# Patient Record
Sex: Female | Born: 1990 | Race: Black or African American | Hispanic: No | Marital: Single | Smoking: Former smoker
Health system: Southern US, Community
[De-identification: ages and names within clinical notes are randomized; demographics above are authoritative.]

## PROBLEM LIST (undated history)

## (undated) ENCOUNTER — Inpatient Hospital Stay: Payer: Self-pay

## (undated) DIAGNOSIS — Z789 Other specified health status: Secondary | ICD-10-CM

## (undated) HISTORY — PX: BREAST LUMPECTOMY: SHX2

## (undated) HISTORY — PX: OTHER SURGICAL HISTORY: SHX169

---

## 2014-01-24 NOTE — L&D Delivery Note (Signed)
Delivery Note At 1:02 AM a viable female was delivered via Vaginal, Spontaneous Delivery (Presentation: LOA ;  ). Mild shoulder dystocia relieved by McRobert's and suprapubic pressure   APGAR: 8, 9; weight  .  Vigorous female with spontaneous cry . Delayed cord clamp  Placenta status: Intact, Spontaneous.  Cord:3v  with the following complications:None.    Anesthesia: Lidocaine , 50 mcg Fentanyl IV Episiotomy: none   Lacerations: second degree and bilateral labial laceration   Suture Repair: 00 + 000 vicrylEst. Blood Loss (mL):200 cc   Mom to postpartum.  Baby to Couplet care / Skin to Skin breast feeding .  SCHERMERHORN,THOMAS 10/22/2014, 1:45 AM

## 2014-04-25 ENCOUNTER — Emergency Department
Admit: 2014-04-25 | Disposition: A | Discharge status: Home / Self Care | Payer: Self-pay | Admitting: Emergency Medicine

## 2014-04-25 LAB — URINALYSIS, COMPLETE
BLOOD: NEGATIVE
Bilirubin,UR: NEGATIVE
Glucose,UR: NEGATIVE mg/dL (ref 0–75)
NITRITE: POSITIVE
PH: 6 (ref 4.5–8.0)
RBC,UR: 10 /HPF (ref 0–5)
Specific Gravity: 1.027 (ref 1.003–1.030)
WBC UR: 11 /HPF (ref 0–5)

## 2014-04-25 LAB — CBC
HCT: 36.1 % (ref 35.0–47.0)
HGB: 12.1 g/dL (ref 12.0–16.0)
MCH: 30.4 pg (ref 26.0–34.0)
MCHC: 33.5 g/dL (ref 32.0–36.0)
MCV: 91 fL (ref 80–100)
PLATELETS: 161 10*3/uL (ref 150–440)
RBC: 3.98 10*6/uL (ref 3.80–5.20)
RDW: 12.8 % (ref 11.5–14.5)
WBC: 8.7 10*3/uL (ref 3.6–11.0)

## 2014-04-25 LAB — COMPREHENSIVE METABOLIC PANEL
ANION GAP: 6 — AB (ref 7–16)
AST: 24 U/L
Albumin: 4.1 g/dL
Alkaline Phosphatase: 36 U/L — ABNORMAL LOW
BUN: 13 mg/dL
Bilirubin,Total: 0.8 mg/dL
CO2: 23 mmol/L
Calcium, Total: 9.5 mg/dL
Chloride: 106 mmol/L
Creatinine: 0.51 mg/dL
EGFR (African American): >60
EGFR (Non-African Amer.): >60
Glucose: 96 mg/dL
Potassium: 3.6 mmol/L
SGPT (ALT): 19 U/L
Sodium: 135 mmol/L
Total Protein: 7.6 g/dL

## 2014-04-25 LAB — HCG, QUANTITATIVE, PREGNANCY: Beta Hcg, Quant.: 157530 m[IU]/mL — ABNORMAL HIGH

## 2014-04-25 LAB — LIPASE, BLOOD: LIPASE: 20 U/L — AB

## 2014-05-09 LAB — OB RESULTS CONSOLE ABO/RH: RH Type: POSITIVE

## 2014-05-09 LAB — OB RESULTS CONSOLE VARICELLA ZOSTER ANTIBODY, IGG: VARICELLA IGG: IMMUNE

## 2014-05-09 LAB — OB RESULTS CONSOLE RPR: RPR: NONREACTIVE

## 2014-05-10 LAB — OB RESULTS CONSOLE GC/CHLAMYDIA
Chlamydia: POSITIVE
Gonorrhea: NEGATIVE

## 2014-05-21 LAB — OB RESULTS CONSOLE RUBELLA ANTIBODY, IGM: RUBELLA: IMMUNE

## 2014-09-19 LAB — OB RESULTS CONSOLE GC/CHLAMYDIA
Chlamydia: NEGATIVE
GC PROBE AMP, GENITAL: NEGATIVE

## 2014-09-19 LAB — OB RESULTS CONSOLE GBS: GBS: NEGATIVE

## 2014-10-21 ENCOUNTER — Inpatient Hospital Stay
Admission: EM | Admit: 2014-10-21 | Discharge: 2014-10-24 | DRG: 775 | Disposition: A | Discharge status: Home / Self Care | Payer: Medicaid Other | Attending: Obstetrics and Gynecology | Admitting: Obstetrics and Gynecology

## 2014-10-21 ENCOUNTER — Observation Stay
Admission: EM | Admit: 2014-10-21 | Discharge: 2014-10-21 | Disposition: A | Discharge status: Home / Self Care | Payer: Medicaid Other | Source: Home / Self Care | Admitting: Obstetrics and Gynecology

## 2014-10-21 DIAGNOSIS — Z3A40 40 weeks gestation of pregnancy: Secondary | ICD-10-CM

## 2014-10-21 DIAGNOSIS — Z3A39 39 weeks gestation of pregnancy: Secondary | ICD-10-CM | POA: Diagnosis present

## 2014-10-21 DIAGNOSIS — O479 False labor, unspecified: Secondary | ICD-10-CM | POA: Diagnosis present

## 2014-10-21 DIAGNOSIS — Z3403 Encounter for supervision of normal first pregnancy, third trimester: Secondary | ICD-10-CM | POA: Diagnosis present

## 2014-10-21 LAB — CBC
HEMATOCRIT: 37.3 % (ref 35.0–47.0)
Hemoglobin: 12.6 g/dL (ref 12.0–16.0)
MCH: 31.3 pg (ref 26.0–34.0)
MCHC: 33.8 g/dL (ref 32.0–36.0)
MCV: 92.4 fL (ref 80.0–100.0)
PLATELETS: 187 10*3/uL (ref 150–440)
RBC: 4.03 MIL/uL (ref 3.80–5.20)
RDW: 12.6 % (ref 11.5–14.5)
WBC: 15.5 10*3/uL — ABNORMAL HIGH (ref 3.6–11.0)

## 2014-10-21 LAB — RAPID HIV SCREEN (HIV 1/2 AB+AG)
HIV 1/2 Antibodies: NONREACTIVE
HIV-1 P24 ANTIGEN - HIV24: NONREACTIVE

## 2014-10-21 LAB — TYPE AND SCREEN
ABO/RH(D): A POS
Antibody Screen: NEGATIVE

## 2014-10-21 LAB — ABO/RH: ABO/RH(D): A POS

## 2014-10-21 MED ORDER — LACTATED RINGERS IV SOLN
INTRAVENOUS | Status: DC
  Administered 2014-10-21: 125 mL/h via INTRAVENOUS

## 2014-10-21 MED ORDER — BUTORPHANOL TARTRATE 1 MG/ML IJ SOLN
1.0000 mg | INTRAMUSCULAR | Status: DC | PRN
  Administered 2014-10-21: 1 mg via INTRAVENOUS
  Filled 2014-10-21: qty 1

## 2014-10-21 MED ORDER — PENICILLIN G POTASSIUM 5000000 UNITS IJ SOLR
2.5000 10*6.[IU] | INTRAVENOUS | Status: DC
  Filled 2014-10-21 (×4): qty 2.5

## 2014-10-21 MED ORDER — LACTATED RINGERS IV SOLN: 500.0000 mL | INTRAVENOUS | Status: DC | PRN

## 2014-10-21 MED ORDER — OXYCODONE-ACETAMINOPHEN 5-325 MG PO TABS
2.0000 | ORAL_TABLET | ORAL | Status: DC | PRN
Start: 2014-10-21 — End: 2014-10-22

## 2014-10-21 MED ORDER — PENICILLIN G POTASSIUM 5000000 UNITS IJ SOLR
5.0000 10*6.[IU] | Freq: Once | INTRAMUSCULAR | Status: DC
  Filled 2014-10-21: qty 5

## 2014-10-21 MED ORDER — LIDOCAINE HCL (PF) 1 % IJ SOLN: 30.0000 mL | INTRAMUSCULAR | Status: DC | PRN

## 2014-10-21 MED ORDER — OXYTOCIN BOLUS FROM INFUSION: 500.0000 mL | INTRAVENOUS | Status: DC

## 2014-10-21 MED ORDER — OXYTOCIN 40 UNITS IN LACTATED RINGERS INFUSION - SIMPLE MED
62.5000 mL/h | INTRAVENOUS | Status: DC
  Administered 2014-10-22: 62.5 mL/h via INTRAVENOUS

## 2014-10-21 MED ORDER — CITRIC ACID-SODIUM CITRATE 334-500 MG/5ML PO SOLN: 30.0000 mL | ORAL | Status: DC | PRN

## 2014-10-21 MED ORDER — ACETAMINOPHEN 325 MG PO TABS: 650.0000 mg | ORAL_TABLET | ORAL | Status: DC | PRN

## 2014-10-21 MED ORDER — OXYCODONE-ACETAMINOPHEN 5-325 MG PO TABS
1.0000 | ORAL_TABLET | ORAL | Status: DC | PRN
Start: 2014-10-21 — End: 2014-10-22

## 2014-10-21 MED ORDER — ONDANSETRON HCL 4 MG/2ML IJ SOLN: 4.0000 mg | Freq: Four times a day (QID) | INTRAMUSCULAR | Status: DC | PRN

## 2014-10-21 NOTE — Progress Notes (Signed)
Patient ID: Kara Moses, female   DOB: 11-28-1990, 24 y.o.   MRN: [id number] SHAVANA CALDER 1991-01-22 G1 P0 [redacted]w[redacted]d presents for ctx  No LOF , small  vaginal bleeding , O;BP 123/73 mmHg  Pulse 92  Temp(Src) 98.7 F (37.1 C)  LMP 01/12/2014 (LMP Unknown) ABDsoft , gravid  NT CX tft by RN  NSTreactive , reasurring Labs: none A: CTX , not in labor yet  P:precautions given , daily FMA ( kick counts)  [facility] appt this Thursday

## 2014-10-21 NOTE — OB Triage Note (Signed)
Pt presents to l/d with c/o contractions since 0600. Pt states she has been receiving prenatal care in [place] until 3 weeks ago when she moved back to [place],

## 2014-10-21 NOTE — OB Triage Note (Signed)
Pt here earlier today. States she was not able to get any rest when she went home. Small amount brown discharge. ( from earlier vaginal exam today)

## 2014-10-21 NOTE — Plan of Care (Signed)
Pt seen by dr schermerhorn. Monitor strip reviewed by him. Reactive nst. Ok to discharge pt home. Pt d/c home with d/c instructions

## 2014-10-21 NOTE — H&P (Signed)
Kara Moses is a 24 y.o. female G1P0 @ 39+0 weeks  presenting for labor d/c earlier in the day in early labor . Now at 4 cm / 80 /-2. Marland KitchenAROM with + meconium stained fluid  History OB History    Gravida Para Term Preterm AB TAB SAB Ectopic Multiple Living   1              No past medical history on file. No past surgical history on file. Family History: family history is not on file. Social History:  has no tobacco, alcohol, and drug history on file.   Prenatal Transfer Tool  Maternal Diabetes: No Genetic Screening: Normal Maternal Ultrasounds/Referrals: Normal Fetal Ultrasounds or other Referrals:  None Maternal Substance Abuse:  No Significant Maternal Medications:  None Significant Maternal Lab Results:  H/o + chlamydia --- treated  04/2014 Other Comments:  None  ROS  Dilation: 4 Effacement (%): 90 Station: -2 Exam by:: t.roberts RN Blood pressure 127/85, pulse 83, temperature 98.3 F (36.8 C), temperature source Oral, resp. rate 20, last menstrual period 01/12/2014. Exam Lungs CTA  cv RRR  Pelvic 4/80/-2 vtx  FSE placed  Physical Exam  Prenatal labs: ABO, Rh: A/Positive/-- (04/15 0000) Antibody:neg Rubella: Immune (04/27 0000) RPR: Nonreactive (04/15 0000)  HBsAg: neg HIV: neg  GBS: Negative (08/26 0000)   Assessment/Plan: Term gestation with reassuring fetal monitoring , active labor  Meconium stained amniotic fluid   Cont monitoring . Offfered CLE vs stadol  SCHERMERHORN,THOMAS 10/21/2014, 8:43 PM

## 2014-10-22 LAB — CBC
HEMATOCRIT: 34.3 % — AB (ref 35.0–47.0)
HEMOGLOBIN: 11.7 g/dL — AB (ref 12.0–16.0)
MCH: 31.6 pg (ref 26.0–34.0)
MCHC: 34 g/dL (ref 32.0–36.0)
MCV: 93 fL (ref 80.0–100.0)
Platelets: 179 10*3/uL (ref 150–440)
RBC: 3.69 MIL/uL — AB (ref 3.80–5.20)
RDW: 12.4 % (ref 11.5–14.5)
WBC: 21.7 10*3/uL — ABNORMAL HIGH (ref 3.6–11.0)

## 2014-10-22 MED ORDER — IBUPROFEN 600 MG PO TABS
600.0000 mg | ORAL_TABLET | Freq: Four times a day (QID) | ORAL | Status: DC
  Administered 2014-10-22 – 2014-10-24 (×8): 600 mg via ORAL
  Filled 2014-10-22 (×9): qty 1

## 2014-10-22 MED ORDER — BENZOCAINE-MENTHOL 20-0.5 % EX AERO
1.0000 "application " | INHALATION_SPRAY | CUTANEOUS | Status: DC | PRN
  Administered 2014-10-22: 1 via TOPICAL
  Filled 2014-10-22: qty 56

## 2014-10-22 MED ORDER — SIMETHICONE 80 MG PO CHEW: 80.0000 mg | CHEWABLE_TABLET | ORAL | Status: DC | PRN

## 2014-10-22 MED ORDER — MAGNESIUM HYDROXIDE 400 MG/5ML PO SUSP: 30.0000 mL | ORAL | Status: DC | PRN

## 2014-10-22 MED ORDER — OXYCODONE-ACETAMINOPHEN 5-325 MG PO TABS
1.0000 | ORAL_TABLET | ORAL | Status: DC | PRN
  Administered 2014-10-22 – 2014-10-24 (×5): 1 via ORAL
  Filled 2014-10-22 (×5): qty 1

## 2014-10-22 MED ORDER — FERROUS SULFATE 325 (65 FE) MG PO TABS
325.0000 mg | ORAL_TABLET | Freq: Two times a day (BID) | ORAL | Status: DC
  Administered 2014-10-22 – 2014-10-24 (×5): 325 mg via ORAL
  Filled 2014-10-22 (×5): qty 1

## 2014-10-22 MED ORDER — SENNOSIDES-DOCUSATE SODIUM 8.6-50 MG PO TABS
2.0000 | ORAL_TABLET | ORAL | Status: DC
  Administered 2014-10-23 – 2014-10-24 (×2): 2 via ORAL
  Filled 2014-10-22 (×2): qty 2

## 2014-10-22 MED ORDER — WITCH HAZEL-GLYCERIN EX PADS: 1.0000 "application " | MEDICATED_PAD | CUTANEOUS | Status: DC | PRN

## 2014-10-22 MED ORDER — FENTANYL CITRATE (PF) 100 MCG/2ML IJ SOLN
INTRAMUSCULAR | Status: AC
Start: 2014-10-22 — End: 2014-10-22
  Administered 2014-10-22: 50 ug via INTRAVENOUS
  Filled 2014-10-22: qty 2

## 2014-10-22 MED ORDER — MEASLES, MUMPS & RUBELLA VAC ~~LOC~~ INJ: 0.5000 mL | INJECTION | Freq: Once | SUBCUTANEOUS | Status: DC

## 2014-10-22 MED ORDER — NITROGLYCERIN 5 MG/ML IV SOLN
INTRAVENOUS | Status: AC
  Filled 2014-10-22: qty 10

## 2014-10-22 MED ORDER — DIBUCAINE 1 % RE OINT: 1.0000 "application " | TOPICAL_OINTMENT | RECTAL | Status: DC | PRN

## 2014-10-22 MED ORDER — DIPHENHYDRAMINE HCL 25 MG PO CAPS: 25.0000 mg | ORAL_CAPSULE | Freq: Four times a day (QID) | ORAL | Status: DC | PRN

## 2014-10-22 MED ORDER — ZOLPIDEM TARTRATE 5 MG PO TABS: 5.0000 mg | ORAL_TABLET | Freq: Every evening | ORAL | Status: DC | PRN

## 2014-10-22 MED ORDER — ACETAMINOPHEN 325 MG PO TABS: 650.0000 mg | ORAL_TABLET | ORAL | Status: DC | PRN

## 2014-10-22 MED ORDER — LANOLIN HYDROUS EX OINT: TOPICAL_OINTMENT | CUTANEOUS | Status: DC | PRN

## 2014-10-22 MED ORDER — OXYTOCIN 40 UNITS IN LACTATED RINGERS INFUSION - SIMPLE MED: 62.5000 mL/h | INTRAVENOUS | Status: DC | PRN

## 2014-10-22 MED ORDER — PRENATAL MULTIVITAMIN CH
1.0000 | ORAL_TABLET | Freq: Every day | ORAL | Status: DC
  Administered 2014-10-22 – 2014-10-24 (×3): 1 via ORAL
  Filled 2014-10-22 (×3): qty 1

## 2014-10-22 MED ORDER — ONDANSETRON HCL 4 MG/2ML IJ SOLN: 4.0000 mg | INTRAMUSCULAR | Status: DC | PRN

## 2014-10-22 MED ORDER — ONDANSETRON HCL 4 MG PO TABS: 4.0000 mg | ORAL_TABLET | ORAL | Status: DC | PRN

## 2014-10-22 MED ORDER — FENTANYL CITRATE (PF) 100 MCG/2ML IJ SOLN
50.0000 ug | Freq: Once | INTRAMUSCULAR | Status: AC
  Administered 2014-10-22: 50 ug via INTRAVENOUS

## 2014-10-23 LAB — RPR: RPR Ser Ql: NONREACTIVE

## 2014-10-23 NOTE — Progress Notes (Signed)
PPD #1, SVD, baby girl   S:  Reports feeling good             Tolerating po/ No nausea or vomiting             Bleeding is light             Pain controlled with Motrin and Percocet             Up ad lib / ambulatory / voiding QS  Newborn breast feeding - going well   O:               VS: BP 109/57 mmHg  Pulse 71  Temp(Src) 98.3 F (36.8 C) (Oral)  Resp 18  SpO2 99%  LMP 01/12/2014 (LMP Unknown)  Breastfeeding? Unknown   LABS:              Recent Labs  10/21/14 2101 10/22/14 0604  WBC 15.5* 21.7*  HGB 12.6 11.7*  PLT 187 179               Blood type: --/--/A POS (09/27 2102)  Rubella: Immune (04/27 0000)                     I&O: Intake/Output      09/28 0701 - 09/29 0700 09/29 0701 - 09/30 0700   P.O. 240    I.V.     Total Intake 240     Urine 800    Blood     Total Output 800     Net -560                        Physical Exam:             Alert and oriented X3  Lungs: Clear and unlabored  Heart: regular rate and rhythm / no mumurs  Abdomen: soft, non-tender, non-distended              Fundus: firm, non-tender, U-E  Perineum: well approximated 2nd degree laceration and bilateral labial lacs - healing well, no significant edema, no ecchymosis, no erythema  Lochia: light, no clots  Extremities: no edema, no calf pain or tenderness    A: PPD # 1, SVD   Doing well - stable status  P: Routine post partum orders  May shower and ambulate in halls today   Planning Nexplanon for birth control  Anticipate discharge home tomorrow  Karena Addison, CNM

## 2014-10-24 MED ORDER — IBUPROFEN 600 MG PO TABS
600.0000 mg | ORAL_TABLET | Freq: Four times a day (QID) | ORAL | Status: DC
Start: 2014-10-24 — End: 2016-08-31

## 2014-10-24 MED ORDER — BENZOCAINE-MENTHOL 20-0.5 % EX AERO: 1.0000 "application " | INHALATION_SPRAY | CUTANEOUS | Status: DC | PRN

## 2014-10-24 MED ORDER — OXYCODONE-ACETAMINOPHEN 5-325 MG PO TABS: 1.0000 | ORAL_TABLET | ORAL | Status: DC | PRN

## 2014-10-24 NOTE — Discharge Summary (Signed)
Obstetric Discharge Summary Reason for Admission: onset of labor Prenatal Procedures: none Intrapartum Procedures: spontaneous vaginal delivery Postpartum Procedures: none Complications-Operative and Postpartum: 2 degree perineal laceration HEMOGLOBIN  Date Value Ref Range Status  10/22/2014 11.7* 12.0 - 16.0 g/dL Final   HGB  Date Value Ref Range Status  04/25/2014 12.1 12.0-16.0 g/dL Final   HCT  Date Value Ref Range Status  10/22/2014 34.3* 35.0 - 47.0 % Final  04/25/2014 36.1 35.0-47.0 % Final    Physical Exam:  General: alert and cooperative Lochia: appropriate Uterine Fundus: firm Incision: n/a DVT Evaluation: No evidence of DVT seen on physical exam. Lungs CTA, CV RRR  Discharge Diagnoses: Term Pregnancy-delivered  Discharge Information: Date: 10/24/2014 Activity: pelvic rest Diet: routine Medications: Ibuprofen and Percocet Condition: stable Instructions: refer to practice specific booklet Discharge to: home Follow-up Information    Follow up with SCHERMERHORN,THOMAS, MD In 6 weeks.   Specialty:  Obstetrics and Gynecology   Why:  postpartum care   Contact information:   [address] [place] [address] [phone]       Newborn Data: Live born female  Birth Weight: 7 lb 15 oz (3600 g) APGAR: 8, 9  Home with mother.  SCHERMERHORN,THOMAS 10/24/2014, 8:50 AM

## 2014-10-24 NOTE — Progress Notes (Signed)
Patient understands all discharge instructions and the need to make follow up appointments. Patient discharge via wheelchair with auxillary. 

## 2014-11-30 ENCOUNTER — Emergency Department
Admission: EM | Admit: 2014-11-30 | Discharge: 2014-11-30 | Discharge status: Against Medical Advice | Payer: Medicaid Other | Attending: Emergency Medicine | Admitting: Emergency Medicine

## 2014-11-30 ENCOUNTER — Encounter: Payer: Self-pay | Admitting: Emergency Medicine

## 2014-11-30 DIAGNOSIS — N939 Abnormal uterine and vaginal bleeding, unspecified: Secondary | ICD-10-CM | POA: Insufficient documentation

## 2014-11-30 LAB — URINALYSIS COMPLETE WITH MICROSCOPIC (ARMC ONLY)
Bacteria, UA: NONE SEEN
Bilirubin Urine: NEGATIVE
GLUCOSE, UA: NEGATIVE mg/dL
Ketones, ur: NEGATIVE mg/dL
Nitrite: NEGATIVE
Protein, ur: 100 mg/dL — AB
Specific Gravity, Urine: 1.015 (ref 1.005–1.030)
pH: 7 (ref 5.0–8.0)

## 2014-11-30 LAB — COMPREHENSIVE METABOLIC PANEL
ALT: 11 U/L — AB (ref 14–54)
AST: 15 U/L (ref 15–41)
Albumin: 4.2 g/dL (ref 3.5–5.0)
Alkaline Phosphatase: 61 U/L (ref 38–126)
Anion gap: 3 — ABNORMAL LOW (ref 5–15)
BUN: 11 mg/dL (ref 6–20)
CHLORIDE: 110 mmol/L (ref 101–111)
CO2: 27 mmol/L (ref 22–32)
CREATININE: 0.7 mg/dL (ref 0.44–1.00)
Calcium: 9.3 mg/dL (ref 8.9–10.3)
GFR calc Af Amer: >60 mL/min (ref >60)
Glucose, Bld: 92 mg/dL (ref 65–99)
Potassium: 3.7 mmol/L (ref 3.5–5.1)
SODIUM: 140 mmol/L (ref 135–145)
Total Bilirubin: 1.4 mg/dL — ABNORMAL HIGH (ref 0.3–1.2)
Total Protein: 7.4 g/dL (ref 6.5–8.1)

## 2014-11-30 LAB — CBC
HCT: 39.5 % (ref 35.0–47.0)
Hemoglobin: 13.2 g/dL (ref 12.0–16.0)
MCH: 30.3 pg (ref 26.0–34.0)
MCHC: 33.5 g/dL (ref 32.0–36.0)
MCV: 90.3 fL (ref 80.0–100.0)
PLATELETS: 151 10*3/uL (ref 150–440)
RBC: 4.38 MIL/uL (ref 3.80–5.20)
RDW: 11.6 % (ref 11.5–14.5)
WBC: 5.2 10*3/uL (ref 3.6–11.0)

## 2014-11-30 LAB — POCT PREGNANCY, URINE: Preg Test, Ur: NEGATIVE

## 2014-11-30 NOTE — ED Notes (Signed)
POC urine preg negative 

## 2014-11-30 NOTE — ED Notes (Signed)
Called at [phone] and [phone]  No answer in lobby

## 2014-11-30 NOTE — ED Notes (Signed)
Pt presents to Er with continued vaginal bleeding since giving birth 5 weeks ago.Pt reports larger than quarter sized blood clots prior to arrival with mild abdominal cramping; goes through 6 pads a day. "it really started bleeding heavy yesterday around 6 pm".

## 2014-12-01 ENCOUNTER — Telehealth: Payer: Self-pay | Admitting: Emergency Medicine

## 2014-12-01 NOTE — ED Notes (Signed)
Called patient due to lwot to inquire about condition and follow up plans. The number will not accept calls, so i was unable to leave message.

## 2016-06-08 ENCOUNTER — Encounter: Payer: Self-pay | Admitting: Emergency Medicine

## 2016-06-08 ENCOUNTER — Emergency Department
Admission: EM | Admit: 2016-06-08 | Discharge: 2016-06-08 | Disposition: A | Discharge status: Home / Self Care | Payer: Medicaid Other | Attending: Emergency Medicine | Admitting: Emergency Medicine

## 2016-06-08 ENCOUNTER — Emergency Department: Payer: Medicaid Other

## 2016-06-08 DIAGNOSIS — Z3A01 Less than 8 weeks gestation of pregnancy: Secondary | ICD-10-CM | POA: Diagnosis not present

## 2016-06-08 DIAGNOSIS — O26851 Spotting complicating pregnancy, first trimester: Secondary | ICD-10-CM | POA: Diagnosis present

## 2016-06-08 DIAGNOSIS — Z79899 Other long term (current) drug therapy: Secondary | ICD-10-CM | POA: Diagnosis not present

## 2016-06-08 DIAGNOSIS — Z7722 Contact with and (suspected) exposure to environmental tobacco smoke (acute) (chronic): Secondary | ICD-10-CM | POA: Diagnosis not present

## 2016-06-08 DIAGNOSIS — O26859 Spotting complicating pregnancy, unspecified trimester: Secondary | ICD-10-CM

## 2016-06-08 LAB — CBC WITH DIFFERENTIAL/PLATELET
BASOS ABS: 0.1 10*3/uL (ref 0–0.1)
Basophils Relative: 1 %
EOS PCT: 1 %
Eosinophils Absolute: 0.1 10*3/uL (ref 0–0.7)
HCT: 36.2 % (ref 35.0–47.0)
Hemoglobin: 12.1 g/dL (ref 12.0–16.0)
LYMPHS ABS: 2.5 10*3/uL (ref 1.0–3.6)
LYMPHS PCT: 37 %
MCH: 29.4 pg (ref 26.0–34.0)
MCHC: 33.5 g/dL (ref 32.0–36.0)
MCV: 87.7 fL (ref 80.0–100.0)
MONO ABS: 0.7 10*3/uL (ref 0.2–0.9)
MONOS PCT: 10 %
Neutro Abs: 3.5 10*3/uL (ref 1.4–6.5)
Neutrophils Relative %: 51 %
PLATELETS: 182 10*3/uL (ref 150–440)
RBC: 4.13 MIL/uL (ref 3.80–5.20)
RDW: 13.3 % (ref 11.5–14.5)
WBC: 6.8 10*3/uL (ref 3.6–11.0)

## 2016-06-08 LAB — URINALYSIS, COMPLETE (UACMP) WITH MICROSCOPIC
BILIRUBIN URINE: NEGATIVE
Glucose, UA: NEGATIVE mg/dL
HGB URINE DIPSTICK: NEGATIVE
Ketones, ur: NEGATIVE mg/dL
Leukocytes, UA: NEGATIVE
NITRITE: POSITIVE — AB
Protein, ur: 30 mg/dL — AB
SPECIFIC GRAVITY, URINE: 1.03 (ref 1.005–1.030)
pH: 6 (ref 5.0–8.0)

## 2016-06-08 LAB — HCG, QUANTITATIVE, PREGNANCY: hCG, Beta Chain, Quant, S: 14034 m[IU]/mL — ABNORMAL HIGH (ref <5)

## 2016-06-08 LAB — POCT PREGNANCY, URINE: PREG TEST UR: POSITIVE — AB

## 2016-06-08 NOTE — ED Provider Notes (Signed)
[facility] Emergency Department Provider Note  ____________________________________________   I have reviewed the triage vital signs and the nursing notes.   HISTORY  Chief Complaint Possible Pregnancy    HPI Kara Moses is a 26 y.o. female who is healthy, she is to her knowledge she is G1 P1 but she has had positive pregnancy tests at home which would make her G2 P1 presents today complaining of no symptoms. She states she had some slight spotting yesterday and she's had positive pregnancy test. She is [place] 6 weeks by dates. She denies any abdominal pain. No nausea vomiting or diarrhea. She is concerned that she had slight spotting positive pregnancy test which she would like to have this evaluated.      No past medical history on file.  Patient Active Problem List   Diagnosis Date Noted  . Indication for care in labor or delivery 10/21/2014  . Irregular uterine contractions 10/21/2014  . Labor abnormality, antepartum 10/21/2014    Past Surgical History:  Procedure Laterality Date  . BREAST LUMPECTOMY      Prior to Admission medications   Medication Sig Start Date End Date Taking? Authorizing Provider  benzocaine-Menthol (DERMOPLAST) 20-0.5 % AERO Apply 1 application topically as needed for irritation (perineal discomfort). 10/24/14   Schermerhorn, Ihor Austin, MD  ibuprofen (ADVIL,MOTRIN) 600 MG tablet Take 1 tablet (600 mg total) by mouth every 6 (six) hours. 10/24/14   Schermerhorn, Ihor Austin, MD  oxyCODONE-acetaminophen (PERCOCET/ROXICET) 5-325 MG tablet Take 1 tablet by mouth every 4 (four) hours as needed (for pain scale 4-7). 10/24/14   Schermerhorn, Ihor Austin, MD    Allergies Patient has no known allergies.  No family history on file.  Social History Social History  Substance Use Topics  . Smoking status: Passive Smoke Exposure - Never Smoker  . Smokeless tobacco: Never Used  . Alcohol use No    Review of  Systems Constitutional: No fever/chills Eyes: No visual changes. ENT: No sore throat. No stiff neck no neck pain Cardiovascular: Denies chest pain. Respiratory: Denies shortness of breath. Gastrointestinal:   no vomiting.  No diarrhea.  No constipation. Genitourinary: Negative for dysuria. Musculoskeletal: Negative lower extremity swelling Skin: Negative for rash. Neurological: Negative for severe headaches, focal weakness or numbness. 10-point ROS otherwise negative.  ____________________________________________   PHYSICAL EXAM:  VITAL SIGNS: ED Triage Vitals  Enc Vitals Group     BP 06/08/16 1702 118/69     Pulse Rate 06/08/16 1702 78     Resp 06/08/16 1702 16     Temp 06/08/16 1702 97.9 F (36.6 C)     Temp Source 06/08/16 1702 Oral     SpO2 06/08/16 1702 100 %     Weight 06/08/16 1703 141 lb (64 kg)     Height 06/08/16 1703 5\' 9"  (1.753 m)     Head Circumference --      Peak Flow --      Pain Score 06/08/16 1702 3     Pain Loc --      Pain Edu? --      Excl. in GC? --     Constitutional: Alert and oriented. Well appearing and in no acute distress. Eyes: Conjunctivae are normal. PERRL. EOMI. Head: Atraumatic. Nose: No congestion/rhinnorhea. Mouth/Throat: Mucous membranes are moist.  Oropharynx non-erythematous. Neck: No stridor.   Nontender with no meningismus Cardiovascular: Normal rate, regular rhythm. Grossly normal heart sounds.  Good peripheral circulation. Respiratory: Normal respiratory effort.  No retractions. Lungs CTAB.  Abdominal: Soft and nontender. No distention. No guarding no rebound Back:  There is no focal tenderness or step off.  there is no midline tenderness there are no lesions noted. there is no CVA tenderness Patient declines pending ultrasound Musculoskeletal: No lower extremity tenderness, no upper extremity tenderness. No joint effusions, no DVT signs strong distal pulses no edema Neurologic:  Normal speech and language. No gross focal  neurologic deficits are appreciated.  Skin:  Skin is warm, dry and intact. No rash noted. Psychiatric: Mood and affect are normal. Speech and behavior are normal.  ____________________________________________   LABS (all labs ordered are listed, but only abnormal results are displayed)  Labs Reviewed  URINALYSIS, COMPLETE (UACMP) WITH MICROSCOPIC - Abnormal; Notable for the following:       Result Value   Color, Urine AMBER (*)    APPearance HAZY (*)    Protein, ur 30 (*)    Nitrite POSITIVE (*)    Bacteria, UA MANY (*)    Squamous Epithelial / LPF 0-5 (*)    All other components within normal limits  HCG, QUANTITATIVE, PREGNANCY - Abnormal; Notable for the following:    hCG, Beta Chain, Quant, S 14,034 (*)    All other components within normal limits  POCT PREGNANCY, URINE - Abnormal; Notable for the following:    Preg Test, Ur POSITIVE (*)    All other components within normal limits  URINE CULTURE  CBC WITH DIFFERENTIAL/PLATELET  POC URINE PREG, ED  ABO/RH   ____________________________________________  EKG  I personally interpreted any EKGs ordered by me or triage  ____________________________________________  RADIOLOGY  I reviewed any imaging ordered by me or triage that were performed during my shift and, if possible, patient and/or family made aware of any abnormal findings. ____________________________________________   PROCEDURES  Procedure(s) performed: None  Procedures  Critical Care performed: None  ____________________________________________   INITIAL IMPRESSION / ASSESSMENT AND PLAN / ED COURSE  Pertinent labs & imaging results that were available during my care of the patient were reviewed by me and considered in my medical decision making (see chart for details).  Patient in early pregnancy with mild spotting she is Rh+, there is nitrites in her urine she has no symptoms of urinary tract infection however she has no dysuria no urinary  frequency, no white cells and no leukocytes, skin be a lab error, sent a urine culture, given that antibiotics are not benign in pregnancy, we will hold off on treatment pending culture. If it is positive we will call her. Patient declined pelvic exam. She has no signs or symptoms of STI, and she states "I am on a deadline I need to go". I don't think there is likely much utility in a pelvic exam at this point but I did expect her limits my workup. Patient did have some spotting, there is no evidence of ongoing bleeding clinically or by history, ultrasound shows an IUP. 2 early for further determination. We will give her close outpatient follow-up with OB/GYN and return precautions for bleeding and pain etc. have been given and understood.    ____________________________________________   FINAL CLINICAL IMPRESSION(S) / ED DIAGNOSES  Final diagnoses:  None      This chart was dictated using voice recognition software.  Despite best efforts to proofread,  errors can occur which can change meaning.      Jeanmarie PlantMcShane, William Schake A, MD 06/08/16 2005

## 2016-06-08 NOTE — ED Triage Notes (Signed)
Pt comes into the ED via POV c/o possible pregnancy.  Last menstrual cycle was 04/26/16.  Patient took 3 at home tests that all came back positive.  Patient would like to know how far along she is since she has not been established with a PCP or OBGYN.

## 2016-06-08 NOTE — Discharge Instructions (Signed)
If you have vaginal bleeding more than a pad a day, vomiting, pain when urinating, significant discomfort in your abdomen or you feel worse in any way please return to the emergency department. Follow up closely with OB/GYN, take prenatal vitamins.

## 2016-06-09 LAB — ABO/RH: ABO/RH(D): A POS

## 2016-06-10 ENCOUNTER — Encounter: Payer: Self-pay | Admitting: Obstetrics and Gynecology

## 2016-06-11 LAB — URINE CULTURE

## 2016-06-12 NOTE — Progress Notes (Signed)
26 y/o F G2P1 d/c from ED 5/16. Patient is roughly [redacted] weeks pregnant. Urine culture resulted with E coli for which Dr. Ileana RoupJames Mcshane authorized Keflex 500 mg bid x 7 days. [facility] [place] as per patient request.   Luisa HartScott Makalynn Berwanger, PharmD Clinical Pharmacist

## 2016-06-18 ENCOUNTER — Emergency Department: Payer: Medicaid Other

## 2016-06-18 ENCOUNTER — Emergency Department
Admission: EM | Admit: 2016-06-18 | Discharge: 2016-06-18 | Disposition: A | Discharge status: Home / Self Care | Payer: Medicaid Other | Attending: Emergency Medicine | Admitting: Emergency Medicine

## 2016-06-18 DIAGNOSIS — O208 Other hemorrhage in early pregnancy: Secondary | ICD-10-CM | POA: Insufficient documentation

## 2016-06-18 DIAGNOSIS — Z7722 Contact with and (suspected) exposure to environmental tobacco smoke (acute) (chronic): Secondary | ICD-10-CM | POA: Diagnosis not present

## 2016-06-18 DIAGNOSIS — O468X1 Other antepartum hemorrhage, first trimester: Secondary | ICD-10-CM

## 2016-06-18 DIAGNOSIS — O200 Threatened abortion: Secondary | ICD-10-CM | POA: Insufficient documentation

## 2016-06-18 DIAGNOSIS — N939 Abnormal uterine and vaginal bleeding, unspecified: Secondary | ICD-10-CM | POA: Diagnosis present

## 2016-06-18 DIAGNOSIS — O418X10 Other specified disorders of amniotic fluid and membranes, first trimester, not applicable or unspecified: Secondary | ICD-10-CM

## 2016-06-18 LAB — COMPREHENSIVE METABOLIC PANEL
ALBUMIN: 4.1 g/dL (ref 3.5–5.0)
ALK PHOS: 53 U/L (ref 38–126)
ALT: 14 U/L (ref 14–54)
AST: 19 U/L (ref 15–41)
Anion gap: 6 (ref 5–15)
BILIRUBIN TOTAL: 0.9 mg/dL (ref 0.3–1.2)
BUN: 13 mg/dL (ref 6–20)
CO2: 23 mmol/L (ref 22–32)
CREATININE: 0.47 mg/dL (ref 0.44–1.00)
Calcium: 9.1 mg/dL (ref 8.9–10.3)
Chloride: 107 mmol/L (ref 101–111)
GFR calc Af Amer: >60 mL/min (ref >60)
GLUCOSE: 106 mg/dL — AB (ref 65–99)
POTASSIUM: 3.4 mmol/L — AB (ref 3.5–5.1)
Sodium: 136 mmol/L (ref 135–145)
TOTAL PROTEIN: 7.1 g/dL (ref 6.5–8.1)

## 2016-06-18 LAB — CBC
HEMATOCRIT: 33.4 % — AB (ref 35.0–47.0)
Hemoglobin: 11.5 g/dL — ABNORMAL LOW (ref 12.0–16.0)
MCH: 30.1 pg (ref 26.0–34.0)
MCHC: 34.4 g/dL (ref 32.0–36.0)
MCV: 87.6 fL (ref 80.0–100.0)
PLATELETS: 171 10*3/uL (ref 150–440)
RBC: 3.81 MIL/uL (ref 3.80–5.20)
RDW: 12.8 % (ref 11.5–14.5)
WBC: 7.9 10*3/uL (ref 3.6–11.0)

## 2016-06-18 LAB — URINALYSIS, COMPLETE (UACMP) WITH MICROSCOPIC
BACTERIA UA: NONE SEEN
Bilirubin Urine: NEGATIVE
Glucose, UA: NEGATIVE mg/dL
HGB URINE DIPSTICK: NEGATIVE
Ketones, ur: NEGATIVE mg/dL
Leukocytes, UA: NEGATIVE
NITRITE: NEGATIVE
PH: 6 (ref 5.0–8.0)
Protein, ur: 30 mg/dL — AB
SPECIFIC GRAVITY, URINE: 1.029 (ref 1.005–1.030)

## 2016-06-18 LAB — LIPASE, BLOOD: Lipase: 21 U/L (ref 11–51)

## 2016-06-18 LAB — HCG, QUANTITATIVE, PREGNANCY: hCG, Beta Chain, Quant, S: 115334 m[IU]/mL — ABNORMAL HIGH (ref <5)

## 2016-06-18 NOTE — ED Triage Notes (Signed)
Pt c/o of vaginal bleeding that started approx 2 weeks ago. Pt was seen here in ED. Pt states bleeding had stopped and started back up today. Pt states spotting on toilet tissue when wiping. Pt denies passing any clots. Pt states N/V and lower bilateral abdominal pain.

## 2016-06-18 NOTE — ED Provider Notes (Signed)
[facility] Emergency Department Provider Note  ____________________________________________   First MD Initiated Contact with Patient 06/18/16 1720     (approximate)  I have reviewed the triage vital signs and the nursing notes.   HISTORY  Chief Complaint Vaginal Bleeding    HPI Kara Moses is a 26 y.o. female who self presents to the emergency department with light vaginal spotting that began 2 hours prior to arrival. She is G2 P1 roughly [redacted] weeks pregnant with a desired pregnancy. She has been unable to establish care with an [facility] gynecologist yet during this pregnancy. She is having cramping aching lower abdominal/pelvic pain for the past several days. She's had no gush of blood or fluid. She has passed no clots.Pain is constant and nothing makes it better or worse nonradiating.   History reviewed. No pertinent past medical history.  Patient Active Problem List   Diagnosis Date Noted  . Indication for care in labor or delivery 10/21/2014  . Irregular uterine contractions 10/21/2014  . Labor abnormality, antepartum 10/21/2014    Past Surgical History:  Procedure Laterality Date  . BREAST LUMPECTOMY    . brice tumor      Prior to Admission medications   Medication Sig Start Date End Date Taking? Authorizing Provider  benzocaine-Menthol (DERMOPLAST) 20-0.5 % AERO Apply 1 application topically as needed for irritation (perineal discomfort). Patient not taking: Reported on 06/18/2016 10/24/14   Schermerhorn, Ihor Austin, MD  ibuprofen (ADVIL,MOTRIN) 600 MG tablet Take 1 tablet (600 mg total) by mouth every 6 (six) hours. Patient not taking: Reported on 06/18/2016 10/24/14   Schermerhorn, Ihor Austin, MD  oxyCODONE-acetaminophen (PERCOCET/ROXICET) 5-325 MG tablet Take 1 tablet by mouth every 4 (four) hours as needed (for pain scale 4-7). Patient not taking: Reported on 06/18/2016 10/24/14   Schermerhorn, Ihor Austin, MD    Allergies Patient has no known  allergies.  No family history on file.  Social History Social History  Substance Use Topics  . Smoking status: Passive Smoke Exposure - Never Smoker  . Smokeless tobacco: Never Used  . Alcohol use No    Review of Systems Constitutional: No fever/chills Eyes: No visual changes. ENT: No sore throat. Cardiovascular: Denies chest pain. Respiratory: Denies shortness of breath. Gastrointestinal: Positive abdominal pain.  Positive nausea, no vomiting.  No diarrhea.  No constipation. Genitourinary: Negative for dysuria. Musculoskeletal: Negative for back pain. Skin: Negative for rash. Neurological: Negative for headaches, focal weakness or numbness.   ____________________________________________   PHYSICAL EXAM:  VITAL SIGNS: ED Triage Vitals  Enc Vitals Group     BP 06/18/16 1616 106/61     Pulse Rate 06/18/16 1616 80     Resp 06/18/16 1616 18     Temp 06/18/16 1616 98.7 F (37.1 C)     Temp Source 06/18/16 1611 Oral     SpO2 06/18/16 1616 100 %     Weight 06/18/16 1611 142 lb (64.4 kg)     Height 06/18/16 1611 5\' 9"  (1.753 m)     Head Circumference --      Peak Flow --      Pain Score 06/18/16 1610 5     Pain Loc --      Pain Edu? --      Excl. in GC? --     Constitutional: Alert and oriented x 4 well appearing nontoxic no diaphoresis speaks in full, clear sentences Eyes: PERRL EOMI. Head: Atraumatic. Nose: No congestion/rhinnorhea. Mouth/Throat: No trismus Neck: No stridor.  Cardiovascular: Normal rate, regular rhythm. Grossly normal heart sounds.  Good peripheral circulation. Respiratory: Normal respiratory effort.  No retractions. Lungs CTAB and moving good air Gastrointestinal: Soft nondistended nontender no rebound or guarding no peritonitis Genitourinary exam: Pelvic exam chaperoned by female nurse Christine: Normal external exam os closed physiologic discharge Musculoskeletal: No lower extremity edema   Neurologic:  Normal speech and language. No gross  focal neurologic deficits are appreciated. Skin:  Skin is warm, dry and intact. No rash noted. Psychiatric: Mood and affect are normal. Speech and behavior are normal.    ____________________________________________   DIFFERENTIAL  Threatened abortion, inevitable abortion, septic abortion, completed abortion ____________________________________________   LABS (all labs ordered are listed, but only abnormal results are displayed)  Labs Reviewed  HCG, QUANTITATIVE, PREGNANCY - Abnormal; Notable for the following:       Result Value   hCG, Beta Chain, Quant, S [id number] (*)    All other components within normal limits  COMPREHENSIVE METABOLIC PANEL - Abnormal; Notable for the following:    Potassium 3.4 (*)    Glucose, Bld 106 (*)    All other components within normal limits  CBC - Abnormal; Notable for the following:    Hemoglobin 11.5 (*)    HCT 33.4 (*)    All other components within normal limits  URINALYSIS, COMPLETE (UACMP) WITH MICROSCOPIC - Abnormal; Notable for the following:    Color, Urine YELLOW (*)    APPearance CLEAR (*)    Protein, ur 30 (*)    Squamous Epithelial / LPF 0-5 (*)    All other components within normal limits  LIPASE, BLOOD    The patient is aren't known to be Rh+ beta hCG consistent with dates __________________________________________  EKG   ____________________________________________  RADIOLOGY  Pelvic ultrasound shows a single live intrauterine pregnancy with small subchorionic hemorrhage ____________________________________________   PROCEDURES  Procedure(s) performed: no  Procedures  Critical Care performed: no  Observation: no ____________________________________________   INITIAL IMPRESSION / ASSESSMENT AND PLAN / ED COURSE  Pertinent labs & imaging results that were available during my care of the patient were reviewed by me and considered in my medical decision making (see chart for details).  The patient is very  well-appearing with slight spotting. Her os is closed. Percent is reassuring although does show some subchorionic hemorrhage. She does not have an OB/GYN swell refer her to one. She understands to continue pelvic rest until that appointment. She is discharged home in stable condition.      ____________________________________________   FINAL CLINICAL IMPRESSION(S) / ED DIAGNOSES  Final diagnoses:  Threatened miscarriage  Subchorionic hematoma in first trimester, single or unspecified fetus      NEW MEDICATIONS STARTED DURING THIS VISIT:  Discharge Medication List as of 06/18/2016  7:27 PM       Note:  This document was prepared using Dragon voice recognition software and may include unintentional dictation errors.     Merrily Brittleifenbark, Ahmed Inniss, MD 06/19/16 1511

## 2016-06-18 NOTE — ED Notes (Signed)
Pt taken to US

## 2016-06-18 NOTE — ED Notes (Signed)
Pt returned from US

## 2016-06-18 NOTE — Discharge Instructions (Addendum)
Please make an appointment to establish care with an OB gynecologist early next week for recheck. Until that appointment please maintain strict pelvic rest. Return to the emergency department for any new or worsening symptoms such as worsening pain, bleeding, or for any other concerns.  It was a pleasure to take care of you today, and thank you for coming to our emergency department.  If you have any questions or concerns before leaving please ask the nurse to grab me and I'm more than happy to go through your aftercare instructions again.  If you were prescribed any opioid pain medication today such as Norco, Vicodin, Percocet, morphine, hydrocodone, or oxycodone please make sure you do not drive when you are taking this medication as it can alter your ability to drive safely.  If you have any concerns once you are home that you are not improving or are in fact getting worse before you can make it to your follow-up appointment, please do not hesitate to call 911 and come back for further evaluation.  Merrily Brittle MD  Results for orders placed or performed during the hospital encounter of 06/18/16  hCG, quantitative, pregnancy  Result Value Ref Range   hCG, Beta Chain, Quant, S 115,334 (H) <5 mIU/mL  Lipase, blood  Result Value Ref Range   Lipase 21 11 - 51 U/L  Comprehensive metabolic panel  Result Value Ref Range   Sodium 136 135 - 145 mmol/L   Potassium 3.4 (L) 3.5 - 5.1 mmol/L   Chloride 107 101 - 111 mmol/L   CO2 23 22 - 32 mmol/L   Glucose, Bld 106 (H) 65 - 99 mg/dL   BUN 13 6 - 20 mg/dL   Creatinine, Ser 4.09 0.44 - 1.00 mg/dL   Calcium 9.1 8.9 - 81.1 mg/dL   Total Protein 7.1 6.5 - 8.1 g/dL   Albumin 4.1 3.5 - 5.0 g/dL   AST 19 15 - 41 U/L   ALT 14 14 - 54 U/L   Alkaline Phosphatase 53 38 - 126 U/L   Total Bilirubin 0.9 0.3 - 1.2 mg/dL   GFR calc non Af Amer >60 >60 mL/min   GFR calc Af Amer >60 >60 mL/min   Anion gap 6 5 - 15  CBC  Result Value Ref Range   WBC 7.9 3.6  - 11.0 K/uL   RBC 3.81 3.80 - 5.20 MIL/uL   Hemoglobin 11.5 (L) 12.0 - 16.0 g/dL   HCT 91.4 (L) 78.2 - 95.6 %   MCV 87.6 80.0 - 100.0 fL   MCH 30.1 26.0 - 34.0 pg   MCHC 34.4 32.0 - 36.0 g/dL   RDW 21.3 08.6 - 57.8 %   Platelets 171 150 - 440 K/uL  Urinalysis, Complete w Microscopic  Result Value Ref Range   Color, Urine YELLOW (A) YELLOW   APPearance CLEAR (A) CLEAR   Specific Gravity, Urine 1.029 1.005 - 1.030   pH 6.0 5.0 - 8.0   Glucose, UA NEGATIVE NEGATIVE mg/dL   Hgb urine dipstick NEGATIVE NEGATIVE   Bilirubin Urine NEGATIVE NEGATIVE   Ketones, ur NEGATIVE NEGATIVE mg/dL   Protein, ur 30 (A) NEGATIVE mg/dL   Nitrite NEGATIVE NEGATIVE   Leukocytes, UA NEGATIVE NEGATIVE   RBC / HPF 0-5 0 - 5 RBC/hpf   WBC, UA 0-5 0 - 5 WBC/hpf   Bacteria, UA NONE SEEN NONE SEEN   Squamous Epithelial / LPF 0-5 (A) NONE SEEN   Mucous PRESENT    US Ob Comp <  14 Wks  Result Date: 06/18/2016 CLINICAL DATA:  Vaginal bleeding. EXAM: OBSTETRIC <14 WK [place] AND TRANSVAGINAL OB US TECHNIQUE: Both transabdominal and transvaginal ultrasound examinations were performed for complete evaluation of the gestation as well as the maternal uterus, adnexal regions, and pelvic cul-de-sac. Transvaginal technique was performed to assess early pregnancy. COMPARISON:  Five scratch set 06/08/2016 FINDINGS: Intrauterine gestational sac: Single Yolk sac:  Yes Embryo:  Yes Cardiac Activity: Yes Heart Rate: 131  bpm CRL:  10  mm   7 w   1 d                  [place] EDC: 02/03/2017 Maternal uterus/adnexae: Subchorionic hemorrhage: Small subchorionic hemorrhage noted. Right ovary: There is a complicated cyst containing diffuse low level echoes measuring 2.5 x 1.9 x 1.7 cm. Left ovary: Normal Other :None Free fluid:  Small volume of free fluid noted. IMPRESSION: 1. Single living intrauterine gestation with an estimated gestational age of [redacted] weeks and 1 day. 2. Small subchorionic hemorrhage 3. Suspect right ovary hemorrhagic cysts. 4. Small  volume of free fluid noted within the pelvis. Electronically Signed   By: Signa Kell M.D.   On: 06/18/2016 19:09   US Ob Comp Less 14 Wks  Result Date: 06/08/2016 CLINICAL DATA:  Vaginal bleeding in first trimester pregnancy. Gestational age by LMP of 6 weeks 1 day. EXAM: OBSTETRIC <14 WK [place] AND TRANSVAGINAL OB US TECHNIQUE: Both transabdominal and transvaginal ultrasound examinations were performed for complete evaluation of the gestation as well as the maternal uterus, adnexal regions, and pelvic cul-de-sac. Transvaginal technique was performed to assess early pregnancy. COMPARISON:  None. FINDINGS: Intrauterine gestational sac: Single Yolk sac:  Visualized. Embryo:  Not Visualized. MSD: 8  mm   5 w   4  d Subchorionic hemorrhage:  None visualized. Maternal uterus/adnexae: Normal appearance of both ovaries. No mass or abnormal free fluid identified. IMPRESSION: Single intrauterine gestational sac measuring 5 weeks 4 days by mean sac diameter. Consider following quantitative beta HCG levels, with followup ultrasound to assess viability in 10 days. Electronically Signed   By: Myles Rosenthal M.D.   On: 06/08/2016 19:40   US Ob Transvaginal  Result Date: 06/18/2016 CLINICAL DATA:  Vaginal bleeding. EXAM: OBSTETRIC <14 WK [place] AND TRANSVAGINAL OB US TECHNIQUE: Both transabdominal and transvaginal ultrasound examinations were performed for complete evaluation of the gestation as well as the maternal uterus, adnexal regions, and pelvic cul-de-sac. Transvaginal technique was performed to assess early pregnancy. COMPARISON:  Five scratch set 06/08/2016 FINDINGS: Intrauterine gestational sac: Single Yolk sac:  Yes Embryo:  Yes Cardiac Activity: Yes Heart Rate: 131  bpm CRL:  10  mm   7 w   1 d                  [place] EDC: 02/03/2017 Maternal uterus/adnexae: Subchorionic hemorrhage: Small subchorionic hemorrhage noted. Right ovary: There is a complicated cyst containing diffuse low level echoes measuring 2.5 x 1.9 x 1.7  cm. Left ovary: Normal Other :None Free fluid:  Small volume of free fluid noted. IMPRESSION: 1. Single living intrauterine gestation with an estimated gestational age of [redacted] weeks and 1 day. 2. Small subchorionic hemorrhage 3. Suspect right ovary hemorrhagic cysts. 4. Small volume of free fluid noted within the pelvis. Electronically Signed   By: Signa Kell M.D.   On: 06/18/2016 19:09   US Ob Transvaginal  Result Date: 06/08/2016 CLINICAL DATA:  Vaginal bleeding in first trimester pregnancy. Gestational age by  LMP of 6 weeks 1 day. EXAM: OBSTETRIC <14 WK US AND TRANSVAGINAL OB US TECHNIQUE: Both transabdominal and transvaginal ultrasound examinations were performed for complete evaluation of the gestation as well as the maternal uterus, adnexal regions, and pelvic cul-de-sac. Transvaginal technique was performed to assess early pregnancy. COMPARISON:  None. FINDINGS: Intrauterine gestational sac: Single Yolk sac:  Visualized. Embryo:  Not Visualized. MSD: 8  mm   5 w   4  d Subchorionic hemorrhage:  None visualized. Maternal uterus/adnexae: Normal appearance of both ovaries. No mass or abnormal free fluid identified. IMPRESSION: Single intrauterine gestational sac measuring 5 weeks 4 days by mean sac diameter. Consider following quantitative beta HCG levels, with followup ultrasound to assess viability in 10 days. Electronically Signed   By: Myles RosenthalJohn  Stahl M.D.   On: 06/08/2016 19:40

## 2016-07-08 ENCOUNTER — Telehealth: Payer: Self-pay

## 2016-07-08 ENCOUNTER — Ambulatory Visit (INDEPENDENT_AMBULATORY_CARE_PROVIDER_SITE_OTHER): Payer: Medicaid Other

## 2016-07-08 ENCOUNTER — Other Ambulatory Visit: Payer: Self-pay | Admitting: Obstetrics and Gynecology

## 2016-07-08 ENCOUNTER — Ambulatory Visit: Payer: Medicaid Other

## 2016-07-08 ENCOUNTER — Other Ambulatory Visit: Payer: Self-pay | Admitting: Certified Nurse Midwife

## 2016-07-08 DIAGNOSIS — O26899 Other specified pregnancy related conditions, unspecified trimester: Secondary | ICD-10-CM

## 2016-07-08 DIAGNOSIS — O208 Other hemorrhage in early pregnancy: Secondary | ICD-10-CM | POA: Diagnosis not present

## 2016-07-08 DIAGNOSIS — Z113 Encounter for screening for infections with a predominantly sexual mode of transmission: Secondary | ICD-10-CM

## 2016-07-08 DIAGNOSIS — R109 Unspecified abdominal pain: Secondary | ICD-10-CM | POA: Diagnosis not present

## 2016-07-08 DIAGNOSIS — O209 Hemorrhage in early pregnancy, unspecified: Secondary | ICD-10-CM

## 2016-07-08 DIAGNOSIS — Z1389 Encounter for screening for other disorder: Secondary | ICD-10-CM

## 2016-07-08 DIAGNOSIS — Z3481 Encounter for supervision of other normal pregnancy, first trimester: Secondary | ICD-10-CM

## 2016-07-08 NOTE — Telephone Encounter (Signed)
Pt returned for ultrasound, which is consistent with LMP. EDD:02/03/2017. Subchorionic hemorrhage resolved. Gave pt a NOB packet, with explanation of what material was provided. NOB labs done. To see Dr. Logan BoresEvans as scheduled on Monday. Pt did complete ATB for UTI in May.

## 2016-07-08 NOTE — Telephone Encounter (Signed)
Pt presents for NOB Nurse Intake appt. G-2, P1001.  After reviewing records it was noted that pt has been seen in the ED x2 this pregnancy with vaginal spotting and abdominal pain. Her 1st visit was on 06/08/2016. Her BHCG: 14,034 and her UPT: positive. Ultrasound resulted in 5.4 weeks by gestation sac. Yolk sac nor embryo visualized. Recommended f/u BHCG levels with f/u US to assess viability in 10 days.  Also noted  UTI and [facility] was to call pt regarding urine culture results.  Pt was to f/u with OB within 2 days. Had an appt on 06/10/16 with Dr. Logan BoresEvans but pt was a no show. Pt had multiple issues why she did not come such as could not find place, working, etc. Pt also seen in the ED on 06/18/16 for TAB (vaginal spotting and abdominal pain). Ultrasound results: small subchorionic hemorrhage, noted viable fetus ([id number]), with EDD 02/03/2017. EGA-10 wks today. Also noted suspect right ovary hemorrhagic cysts and small volume of free fluid noted within pelvis.  Was also to be on pelvic rest.  Pt scheduled for an ultrasound today and a f/u ED for TAB with Dr. Logan BoresEvans on Monday. Pt not not having any vaginal spotting today but is having abdominal pain/cramping.

## 2016-07-11 ENCOUNTER — Ambulatory Visit (INDEPENDENT_AMBULATORY_CARE_PROVIDER_SITE_OTHER): Payer: Medicaid Other | Admitting: Obstetrics and Gynecology

## 2016-07-11 ENCOUNTER — Encounter: Payer: Self-pay | Admitting: Obstetrics and Gynecology

## 2016-07-11 VITALS — BP 112/69 | HR 89 | Ht 69.0 in | Wt 146.6 lb

## 2016-07-11 DIAGNOSIS — O200 Threatened abortion: Secondary | ICD-10-CM | POA: Diagnosis not present

## 2016-07-11 LAB — MICROSCOPIC EXAMINATION: Casts: NONE SEEN /lpf

## 2016-07-11 LAB — POCT URINALYSIS DIPSTICK
BILIRUBIN UA: NEGATIVE
Glucose, UA: NEGATIVE
Ketones, UA: NEGATIVE
Leukocytes, UA: NEGATIVE
NITRITE UA: NEGATIVE
Protein, UA: NEGATIVE
RBC UA: NEGATIVE
Spec Grav, UA: 1.025 (ref 1.010–1.025)
Urobilinogen, UA: 0.2 E.U./dL
pH, UA: 5 (ref 5.0–8.0)

## 2016-07-11 LAB — CBC WITH DIFFERENTIAL/PLATELET
BASOS ABS: 0 10*3/uL (ref 0.0–0.2)
Basos: 0 %
EOS (ABSOLUTE): 0.1 10*3/uL (ref 0.0–0.4)
Eos: 1 %
HEMOGLOBIN: 10.6 g/dL — AB (ref 11.1–15.9)
Hematocrit: 32.8 % — ABNORMAL LOW (ref 34.0–46.6)
IMMATURE GRANS (ABS): 0 10*3/uL (ref 0.0–0.1)
IMMATURE GRANULOCYTES: 0 %
LYMPHS ABS: 1.9 10*3/uL (ref 0.7–3.1)
Lymphs: 24 %
MCH: 28.8 pg (ref 26.6–33.0)
MCHC: 32.3 g/dL (ref 31.5–35.7)
MCV: 89 fL (ref 79–97)
MONOCYTES: 9 %
Monocytes Absolute: 0.7 10*3/uL (ref 0.1–0.9)
NEUTROS PCT: 66 %
Neutrophils Absolute: 5.1 10*3/uL (ref 1.4–7.0)
Platelets: 181 10*3/uL (ref 150–379)
RBC: 3.68 x10E6/uL — AB (ref 3.77–5.28)
RDW: 13.8 % (ref 12.3–15.4)
WBC: 7.8 10*3/uL (ref 3.4–10.8)

## 2016-07-11 LAB — RPR: RPR: NONREACTIVE

## 2016-07-11 LAB — URINALYSIS, ROUTINE W REFLEX MICROSCOPIC
BILIRUBIN UA: NEGATIVE
Glucose, UA: NEGATIVE
KETONES UA: NEGATIVE
Nitrite, UA: POSITIVE — AB
RBC UA: NEGATIVE
Urobilinogen, Ur: 1 mg/dL (ref 0.2–1.0)
pH, UA: 6.5 (ref 5.0–7.5)

## 2016-07-11 LAB — URINE CULTURE, OB REFLEX

## 2016-07-11 LAB — GC/CHLAMYDIA PROBE AMP
CHLAMYDIA, DNA PROBE: NEGATIVE
NEISSERIA GONORRHOEAE BY PCR: NEGATIVE

## 2016-07-11 LAB — MONITOR DRUG PROFILE 14(MW)
Amphetamine Scrn, Ur: NEGATIVE ng/mL
BARBITURATE SCREEN URINE: NEGATIVE ng/mL
BENZODIAZEPINE SCREEN, URINE: NEGATIVE ng/mL
Buprenorphine, Urine: NEGATIVE ng/mL
CANNABINOIDS UR QL SCN: NEGATIVE ng/mL
CREATININE(CRT), U: 172.1 mg/dL (ref 20.0–300.0)
Cocaine (Metab) Scrn, Ur: NEGATIVE ng/mL
Fentanyl, Urine: NEGATIVE pg/mL
METHADONE SCREEN, URINE: NEGATIVE ng/mL
Meperidine Screen, Urine: NEGATIVE ng/mL
OXYCODONE+OXYMORPHONE UR QL SCN: NEGATIVE ng/mL
Opiate Scrn, Ur: NEGATIVE ng/mL
Ph of Urine: 6.3 (ref 4.5–8.9)
Phencyclidine Qn, Ur: NEGATIVE ng/mL
Propoxyphene Scrn, Ur: NEGATIVE ng/mL
SPECIFIC GRAVITY: 1.031
Tramadol Screen, Urine: NEGATIVE ng/mL

## 2016-07-11 LAB — SICKLE CELL SCREEN: Sickle Cell Screen: NEGATIVE

## 2016-07-11 LAB — VARICELLA ZOSTER ANTIBODY, IGG: VARICELLA: 1137 {index} (ref >165)

## 2016-07-11 LAB — NICOTINE SCREEN, URINE: Cotinine Ql Scrn, Ur: NEGATIVE ng/mL

## 2016-07-11 LAB — RUBELLA SCREEN: Rubella Antibodies, IGG: 4.89 index (ref >0.99)

## 2016-07-11 LAB — HIV ANTIBODY (ROUTINE TESTING W REFLEX): HIV Screen 4th Generation wRfx: NONREACTIVE

## 2016-07-11 LAB — PLEASE NOTE

## 2016-07-11 LAB — RH TYPE: Rh Factor: POSITIVE

## 2016-07-11 LAB — CULTURE, OB URINE

## 2016-07-11 LAB — ABO

## 2016-07-11 LAB — ANTIBODY SCREEN: ANTIBODY SCREEN: NEGATIVE

## 2016-07-11 LAB — HEPATITIS B SURFACE ANTIGEN: Hepatitis B Surface Ag: NEGATIVE

## 2016-07-11 NOTE — Progress Notes (Signed)
HPI:      Ms. Kara Moses is a 26 y.o. G2P1001 who LMP was Patient's last menstrual period was 04/26/2016 (approximate).  Subjective:   She presents today After being seen twice in the emergency department for vaginal bleeding in the first trimester. She has had ultrasounds the first which revealed a subchorionic hemorrhage. The second revealed resolution of this hemorrhage. It also confirmed viability of an intrauterine pregnancy. She presents today as follow-up to this bleeding. She states that her bleeding and cramping have completely resolved. She has not begun prenatal vitamins. She has had some nausea and vomiting but this is improving. Her first pregnancy was uncomplicated vaginal delivery at term.    Hx: The following portions of the patient's history were reviewed and updated as appropriate:             She  has no past medical history on file. She  does not have any pertinent problems on file. She  has a past surgical history that includes Breast lumpectomy and brice tumor. Her family history includes Diabetes in her mother; Stroke in her maternal aunt. She  reports that she has quit smoking. She has never used smokeless tobacco. She reports that she does not drink alcohol or use drugs. She has No Known Allergies.       Review of Systems:  Review of Systems  Constitutional: Denied constitutional symptoms, night sweats, recent illness, fatigue, fever, insomnia and weight loss.  Eyes: Denied eye symptoms, eye pain, photophobia, vision change and visual disturbance.  Ears/Nose/Throat/Neck: Denied ear, nose, throat or neck symptoms, hearing loss, nasal discharge, sinus congestion and sore throat.  Cardiovascular: Denied cardiovascular symptoms, arrhythmia, chest pain/pressure, edema, exercise intolerance, orthopnea and palpitations.  Respiratory: Denied pulmonary symptoms, asthma, pleuritic pain, productive sputum, cough, dyspnea and wheezing.  Gastrointestinal: Denied,  gastro-esophageal reflux, melena, nausea and vomiting.  Genitourinary: Denied genitourinary symptoms including symptomatic vaginal discharge, pelvic relaxation issues, and urinary complaints.  Musculoskeletal: Denied musculoskeletal symptoms, stiffness, swelling, muscle weakness and myalgia.  Dermatologic: Denied dermatology symptoms, rash and scar.  Neurologic: Denied neurology symptoms, dizziness, headache, neck pain and syncope.  Psychiatric: Denied psychiatric symptoms, anxiety and depression.  Endocrine: Denied endocrine symptoms including hot flashes and night sweats.   Meds:   Current Outpatient Prescriptions on File Prior to Visit  Medication Sig Dispense Refill  . ibuprofen (ADVIL,MOTRIN) 600 MG tablet Take 1 tablet (600 mg total) by mouth every 6 (six) hours. (Patient not taking: Reported on 06/18/2016) 60 tablet 0   No current facility-administered medications on file prior to visit.     Objective:     Vitals:   07/11/16 0934  BP: 112/69  Pulse: 89                Assessment:    G2P1001 Patient Active Problem List   Diagnosis Date Noted  . Indication for care in labor or delivery 10/21/2014  . Irregular uterine contractions 10/21/2014  . Labor abnormality, antepartum 10/21/2014     1. Threatened abortion in early pregnancy     Patient doing well at this time. Resolution of subchorionic hemorrhage noted at ultrasound.   Plan:            1.  Miscarriage warnings given.  2.  Begin prenatal vitamins   Orders Orders Placed This Encounter  Procedures  . POCT urinalysis dipstick     Meds ordered this encounter  Medications  . acetaminophen (TYLENOL) 325 MG tablet    Sig: Take 650  mg by mouth every 6 (six) hours as needed.        F/U  Return in about 3 weeks (around 08/01/2016). I spent 31 minutes with this patient of which greater than 50% was spent discussing first trimester bleeding, subchorionic hemorrhage, prenatal lab work, prenatal vitamins,  follow-up should bleeding beginning again, previous pregnancy history.  Elonda Huskyavid J. Ma Munoz, M.D. 07/11/2016 10:18 AM

## 2016-07-15 ENCOUNTER — Encounter: Payer: Self-pay | Admitting: Obstetrics and Gynecology

## 2016-07-18 ENCOUNTER — Ambulatory Visit: Payer: Medicaid Other

## 2016-07-22 ENCOUNTER — Encounter: Payer: Self-pay | Admitting: Obstetrics and Gynecology

## 2016-07-26 ENCOUNTER — Encounter: Payer: Self-pay | Admitting: Obstetrics and Gynecology

## 2016-08-03 ENCOUNTER — Other Ambulatory Visit: Payer: Self-pay | Admitting: Obstetrics and Gynecology

## 2016-08-03 ENCOUNTER — Encounter: Payer: Self-pay | Admitting: Obstetrics and Gynecology

## 2016-08-03 ENCOUNTER — Ambulatory Visit (INDEPENDENT_AMBULATORY_CARE_PROVIDER_SITE_OTHER): Payer: Medicaid Other | Admitting: Obstetrics and Gynecology

## 2016-08-03 VITALS — BP 97/60 | HR 86 | Wt 151.0 lb

## 2016-08-03 DIAGNOSIS — Z3481 Encounter for supervision of other normal pregnancy, first trimester: Secondary | ICD-10-CM | POA: Diagnosis not present

## 2016-08-03 DIAGNOSIS — Z3492 Encounter for supervision of normal pregnancy, unspecified, second trimester: Secondary | ICD-10-CM

## 2016-08-03 DIAGNOSIS — N898 Other specified noninflammatory disorders of vagina: Secondary | ICD-10-CM

## 2016-08-03 DIAGNOSIS — O26891 Other specified pregnancy related conditions, first trimester: Secondary | ICD-10-CM

## 2016-08-03 LAB — POCT URINALYSIS DIPSTICK
Bilirubin, UA: NEGATIVE
GLUCOSE UA: NEGATIVE
Ketones, UA: NEGATIVE
Leukocytes, UA: NEGATIVE
Nitrite, UA: POSITIVE
RBC UA: NEGATIVE
SPEC GRAV UA: 1.025 (ref 1.010–1.025)
UROBILINOGEN UA: 0.2 U/dL
pH, UA: 6 (ref 5.0–8.0)

## 2016-08-03 NOTE — Progress Notes (Signed)
Physical examination General NAD, Conversant  HEENT Atraumatic; Op clear with mmm.  Normo-cephalic. Pupils reactive. Anicteric sclerae  Thyroid/Neck Smooth without nodularity or enlargement. Normal ROM.  Neck Supple.  Skin No rashes, lesions or ulceration. Normal palpated skin turgor. No nodularity.  Breasts: No masses or discharge.  Symmetric.  No axillary adenopathy.  Lungs: Clear to auscultation.No rales or wheezes. Normal Respiratory effort, no retractions.  Heart: NSR.  No murmurs or rubs appreciated. No periferal edema  Abdomen: Soft.  Non-tender.  No masses.  No HSM. No hernia  Extremities: Moves all appropriately.  Normal ROM for age. No lymphadenopathy.  Neuro: Oriented to PPT.  Normal mood. Normal affect.     Pelvic:   Vulva: Normal appearance.  No lesions.  Vagina: No lesions or abnormalities noted.  Support: Normal pelvic support.  Urethra No masses tenderness or scarring.  Meatus Normal size without lesions or prolapse.  Cervix: Normal appearance.  No lesions.  Anus: Normal exam.  No lesions.  Perineum: Normal exam.  No lesions.        Bimanual   Adnexae: No masses.  Non-tender to palpation.  Uterus: Enlarged. 13 wks  Non-tender.  Mobile.  AV.  Adnexae: No masses.  Non-tender to palpation.  Cul-de-sac: Negative for abnormality.  Adnexae: No masses.  Non-tender to palpation.         Pelvimetry   Diagonal: Reached.  Spines: Average.  Sacrum: Concave.  Pubic Arch: Normal.    WET PREP: clue cells: absent, KOH (yeast): negative, odor: absent and trichomoniasis: negative Ph:  < 4.5

## 2016-08-03 NOTE — Progress Notes (Signed)
NOB:  See PE.  Patient complains of brown vaginal discharge would like to be "checked" - wet prep negative.  Likely brown discharge from subchorionic hemorrhage.  Denies any bright red bleeding.  Desires MaterniT21 - drawn today. Needs AFP at next visit.  Pap GC/CT done.

## 2016-08-03 NOTE — Addendum Note (Signed)
Addended by: Brooke DareSICK, Arien Benincasa L on: 08/03/2016 02:13 PM   Modules accepted: Orders

## 2016-08-04 ENCOUNTER — Encounter: Payer: Self-pay | Admitting: Obstetrics and Gynecology

## 2016-08-04 ENCOUNTER — Telehealth: Payer: Self-pay | Admitting: Obstetrics and Gynecology

## 2016-08-04 NOTE — Telephone Encounter (Signed)
Patient was unable to check out of her appointment, because of computer problems on 08/03/16. I called the patient to check out and schedule future appointment, I got no answer so I lvm for patient to do so. Thank you.

## 2016-08-05 LAB — URINE CULTURE

## 2016-08-09 ENCOUNTER — Telehealth: Payer: Self-pay

## 2016-08-09 MED ORDER — NITROFURANTOIN MONOHYD MACRO 100 MG PO CAPS: 100.0000 mg | ORAL_CAPSULE | Freq: Two times a day (BID) | ORAL | 1 refills | Status: DC

## 2016-08-09 NOTE — Telephone Encounter (Signed)
Mychart message sent. Script sent to pharmacy

## 2016-08-09 NOTE — Telephone Encounter (Signed)
-----   Message from Linzie Collinavid James Evans, MD sent at 08/09/2016 10:29 AM EDT ----- Results are c/w UTI - needs Rx: MacroBid 1 PO BID for 7 days

## 2016-08-09 NOTE — Telephone Encounter (Signed)
-----   Message from David James Evans, MD sent at 08/09/2016 10:29 AM EDT ----- Results are c/w UTI - needs Rx: MacroBid 1 PO BID for 7 days 

## 2016-08-10 LAB — MATERNIT 21 PLUS CORE, BLOOD
CHROMOSOME 13: NEGATIVE
CHROMOSOME 21: NEGATIVE
Chromosome 18: NEGATIVE
Y Chromosome: NOT DETECTED

## 2016-08-16 LAB — PAP IG, CT-NG, RFX HPV ASCU
CHLAMYDIA, NUC. ACID AMP: NEGATIVE
GONOCOCCUS BY NUCLEIC ACID AMP: NEGATIVE
PAP Smear Comment: 0

## 2016-08-16 LAB — PLEASE NOTE

## 2016-08-16 LAB — HPV DNA PROBE HIGH RISK, AMPLIFIED: HPV, HIGH-RISK: NEGATIVE

## 2016-08-17 ENCOUNTER — Telehealth: Payer: Self-pay

## 2016-08-17 NOTE — Telephone Encounter (Signed)
Mychart message sent.

## 2016-08-17 NOTE — Telephone Encounter (Signed)
-----   Message from David James Evans, MD sent at 08/11/2016 11:14 AM EDT ----- Fetal Screening test is negative for abnormalities. 

## 2016-08-31 ENCOUNTER — Ambulatory Visit (INDEPENDENT_AMBULATORY_CARE_PROVIDER_SITE_OTHER): Payer: Medicaid Other | Admitting: Obstetrics and Gynecology

## 2016-08-31 VITALS — BP 98/64 | HR 97 | Wt 156.5 lb

## 2016-08-31 DIAGNOSIS — Z1379 Encounter for other screening for genetic and chromosomal anomalies: Secondary | ICD-10-CM

## 2016-08-31 DIAGNOSIS — O2342 Unspecified infection of urinary tract in pregnancy, second trimester: Secondary | ICD-10-CM

## 2016-08-31 DIAGNOSIS — Z3482 Encounter for supervision of other normal pregnancy, second trimester: Secondary | ICD-10-CM

## 2016-08-31 LAB — POCT URINALYSIS DIPSTICK
Bilirubin, UA: NEGATIVE
Glucose, UA: NEGATIVE
Ketones, UA: NEGATIVE
Nitrite, UA: NEGATIVE
PH UA: 6.5 (ref 5.0–8.0)
RBC UA: NEGATIVE
UROBILINOGEN UA: 0.2 U/dL

## 2016-08-31 MED ORDER — NITROFURANTOIN MONOHYD MACRO 100 MG PO CAPS: 100.0000 mg | ORAL_CAPSULE | Freq: Every day | ORAL | 8 refills | Status: DC

## 2016-08-31 MED ORDER — PROVIDA DHA 16-16-1.25-110 MG PO CAPS: 16.0000 mg | ORAL_CAPSULE | Freq: Every day | ORAL | 11 refills | Status: DC

## 2016-08-31 NOTE — Progress Notes (Signed)
ROB: Doing well, no complaints. MaterniT21 normal. For msAFP today. Had UTI last visit, treated. For TOC today. This is second UTI in pregnancy, will need prophylactic therapy with Macrobid for remainder of the pregnancy. For anatomy scan in 2 weeks. ROB in 4 weeks.

## 2016-08-31 NOTE — Patient Instructions (Addendum)

## 2016-09-02 LAB — URINE CULTURE

## 2016-09-03 LAB — AFP, SERUM, OPEN SPINA BIFIDA
AFP MoM: 1.15
AFP VALUE AFPOSL: 48.5 ng/mL
GEST. AGE ON COLLECTION DATE: 17.4 wk
Maternal Age At EDD: 24.8 yr
OSBR RISK 1 IN: 10000
TEST RESULTS AFP: NEGATIVE
WEIGHT: 157 [lb_av]

## 2016-09-06 ENCOUNTER — Ambulatory Visit (INDEPENDENT_AMBULATORY_CARE_PROVIDER_SITE_OTHER): Payer: Medicaid Other | Admitting: Obstetrics and Gynecology

## 2016-09-06 DIAGNOSIS — O2342 Unspecified infection of urinary tract in pregnancy, second trimester: Secondary | ICD-10-CM | POA: Diagnosis not present

## 2016-09-06 MED ORDER — CEFTRIAXONE SODIUM 500 MG IJ SOLR
500.0000 mg | Freq: Once | INTRAMUSCULAR | Status: AC
  Administered 2016-09-06: 500 mg via INTRAMUSCULAR

## 2016-09-06 NOTE — Progress Notes (Signed)
Patient receiving Rochephin for recurrent UTI.  No reactions s/p injection. [facility].   Hildred Laserherry, Wylma Tatem, MD [facility]

## 2016-09-06 NOTE — Progress Notes (Signed)
Pt is here for her rocephin 1gram injection

## 2016-09-15 ENCOUNTER — Other Ambulatory Visit: Payer: Medicaid Other

## 2016-09-16 ENCOUNTER — Ambulatory Visit (INDEPENDENT_AMBULATORY_CARE_PROVIDER_SITE_OTHER): Payer: Medicaid Other

## 2016-09-16 DIAGNOSIS — Z3482 Encounter for supervision of other normal pregnancy, second trimester: Secondary | ICD-10-CM

## 2016-09-19 ENCOUNTER — Encounter: Payer: Self-pay | Admitting: Obstetrics and Gynecology

## 2016-09-20 ENCOUNTER — Encounter: Payer: Self-pay | Admitting: Obstetrics and Gynecology

## 2016-09-21 ENCOUNTER — Telehealth: Payer: Self-pay

## 2016-09-21 NOTE — Telephone Encounter (Signed)
Spoke with pt and providers instructions given with understanding.

## 2016-09-26 IMAGING — US US OB LIMITED
1 series · 14 of 28 positions shown · non-contrast
Comparison: none

CLINICAL DATA: Left-sided pelvic pain.

EXAM:
LIMITED OBSTETRIC ULTRASOUND

[Series 1: us ob limited · 0.24mm/px · 14 of 35 slices shown]
[im 2/35]
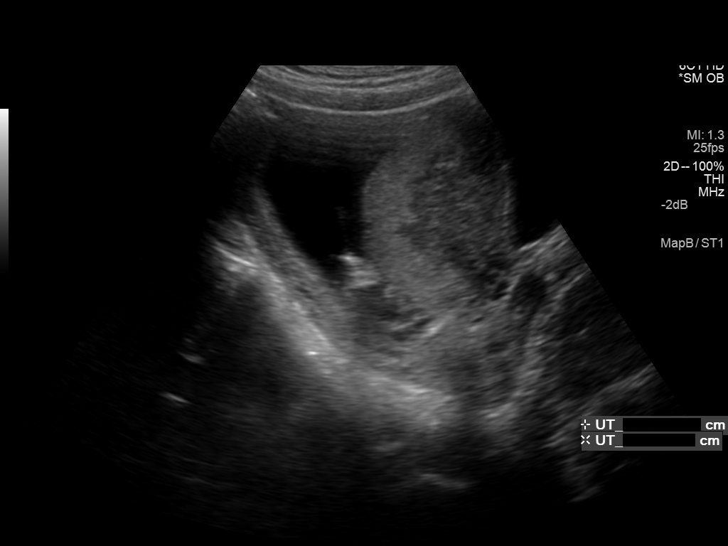
[im 4/35]
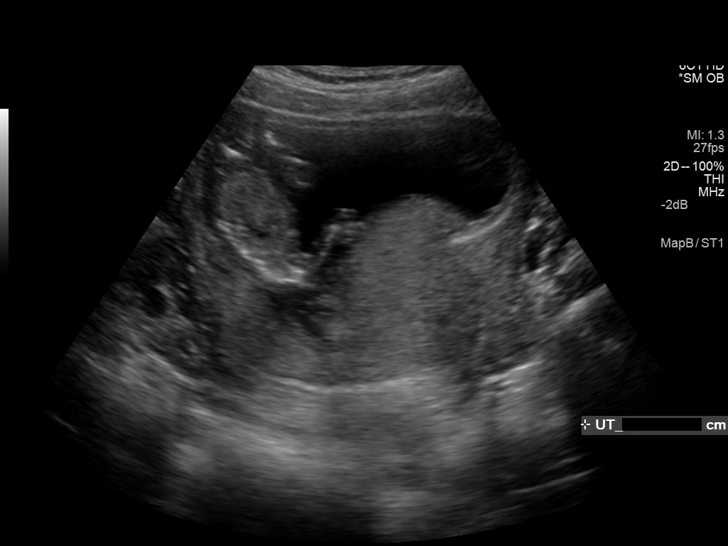
[im 7/35]
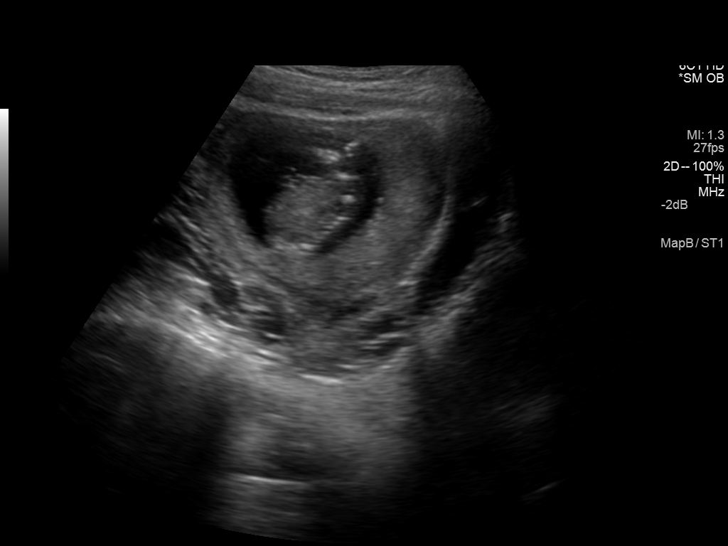
[im 9/35]
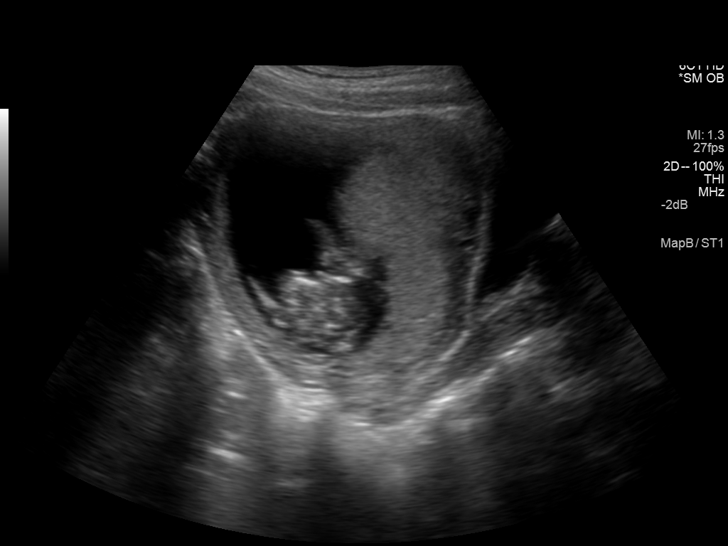
[im 12/35]
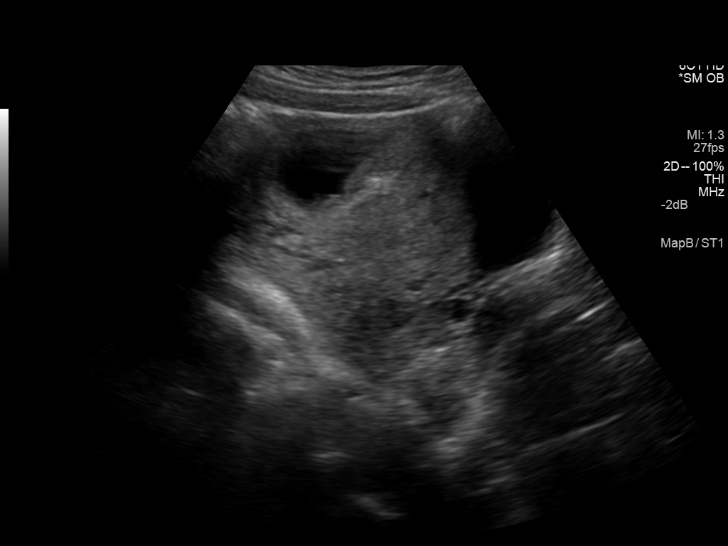
[im 14/35]
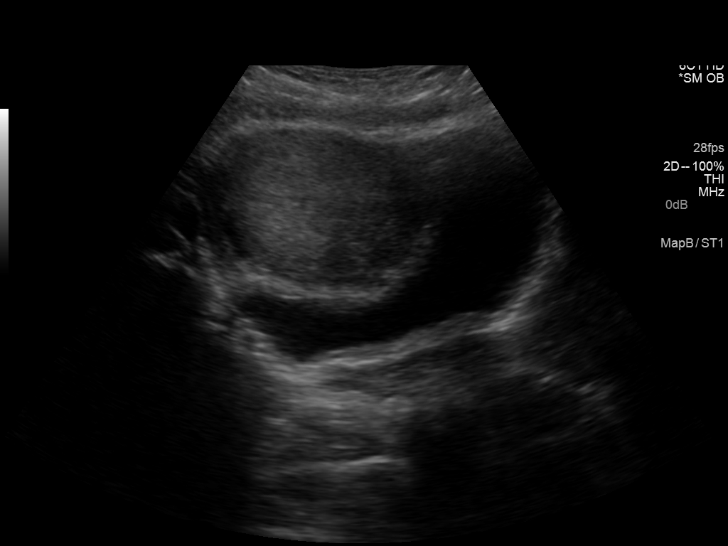
[im 17/35]
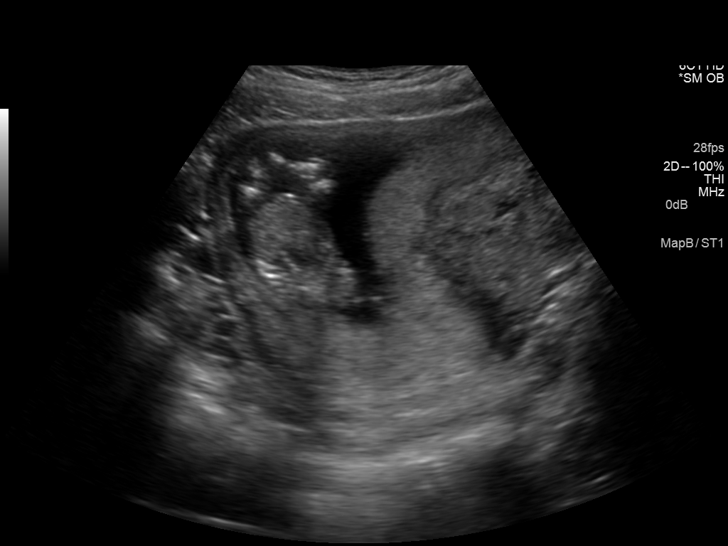
[im 19/35]
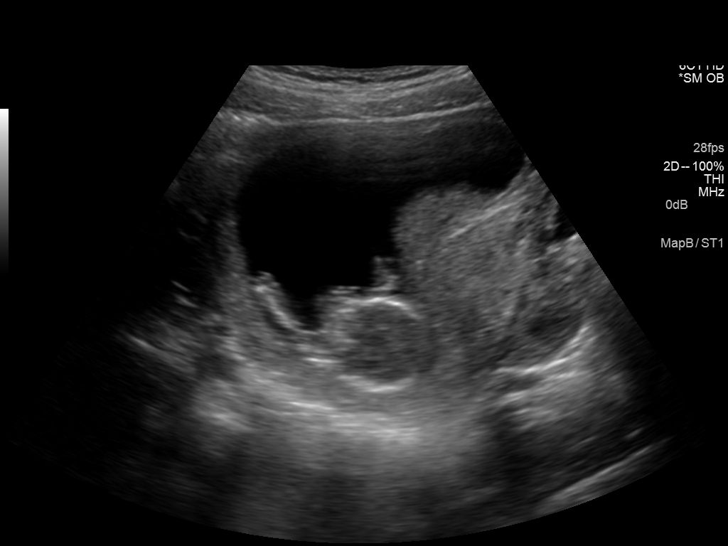
[im 22/35]
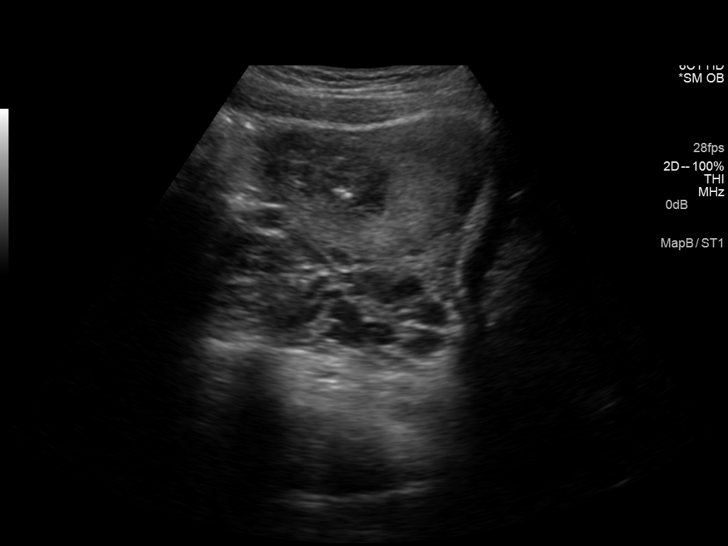
[im 24/35]
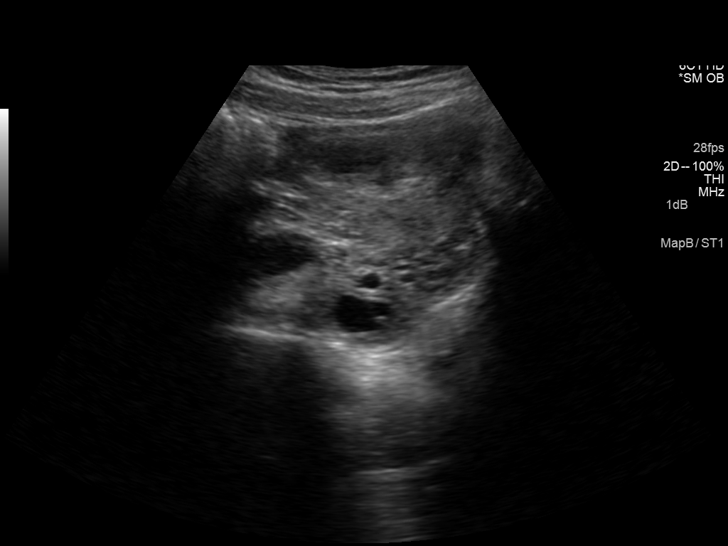
[im 27/35]
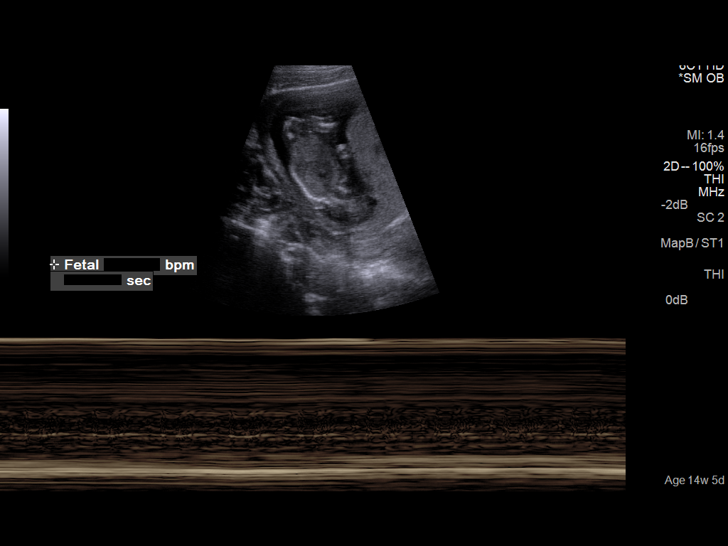
[im 29/35]
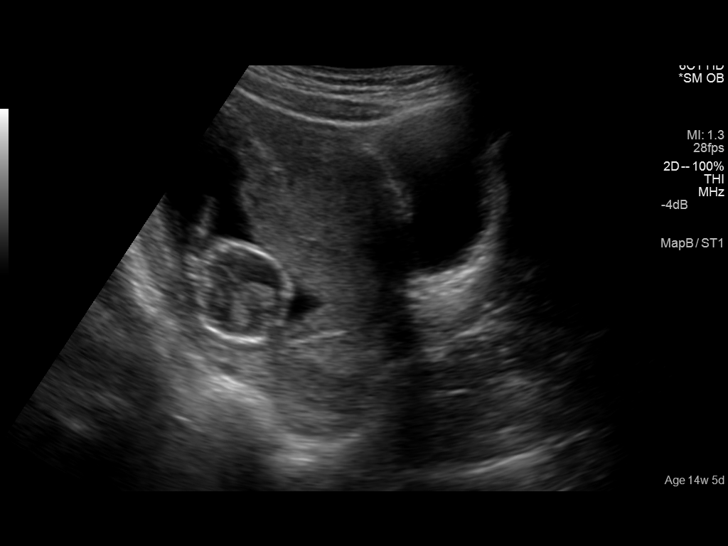
[im 32/35]
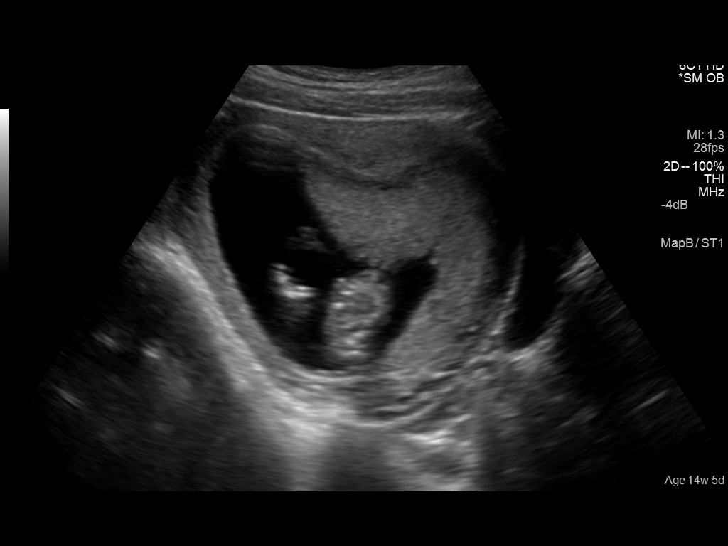
[im 35/35]
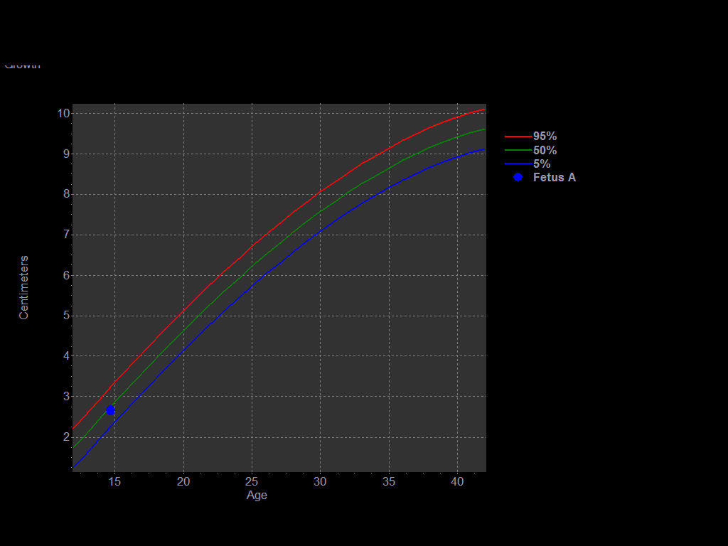

[14 of 28 positions shown; findings below may reference images not displayed]

FINDINGS: Number of Fetuses: 1

Heart Rate:  153 bpm

Movement: Yes

Presentation: Cephalic

Placental Location: Anterior

Previa: Marginal

Amniotic Fluid (Subjective):  Within normal limits.

BPD:  2.7cm 14w  5d

MATERNAL FINDINGS:

Cervix:  Appears closed.

Uterus/Adnexae:  Appears normal.
IMPRESSION: Intrauterine pregnancy of approximately 14 weeks 5 days gestation.
No acute abnormalities are visualized.

This exam is performed on an emergent basis and does not
comprehensively evaluate fetal size, dating, or anatomy; follow-up
complete OB US should be considered if further fetal assessment is
warranted.

## 2016-09-28 ENCOUNTER — Encounter: Payer: Self-pay | Admitting: Obstetrics and Gynecology

## 2016-09-28 ENCOUNTER — Ambulatory Visit (INDEPENDENT_AMBULATORY_CARE_PROVIDER_SITE_OTHER): Payer: Medicaid Other | Admitting: Obstetrics and Gynecology

## 2016-09-28 VITALS — BP 98/59 | HR 97 | Wt 166.2 lb

## 2016-09-28 DIAGNOSIS — Z3482 Encounter for supervision of other normal pregnancy, second trimester: Secondary | ICD-10-CM

## 2016-09-28 LAB — POCT URINALYSIS DIPSTICK
Bilirubin, UA: NEGATIVE
GLUCOSE UA: NEGATIVE
Ketones, UA: NEGATIVE
LEUKOCYTES UA: NEGATIVE
NITRITE UA: NEGATIVE
PH UA: 6 (ref 5.0–8.0)
Protein, UA: NEGATIVE
RBC UA: NEGATIVE
Spec Grav, UA: 1.025 (ref 1.010–1.025)
UROBILINOGEN UA: 0.2 U/dL

## 2016-09-28 NOTE — Progress Notes (Signed)
ROB  taking daily Macrobid for recurrent UTIs. Urine clear today. No further brown discharge or spotting. Low-lying placenta on ultrasound consider repeat at 28 weeks. Discussed in detail with patient.

## 2016-10-26 ENCOUNTER — Encounter: Payer: Medicaid Other | Admitting: Obstetrics and Gynecology

## 2016-10-30 ENCOUNTER — Encounter: Payer: Self-pay | Admitting: Obstetrics and Gynecology

## 2016-10-31 ENCOUNTER — Encounter: Payer: Self-pay | Admitting: Obstetrics and Gynecology

## 2016-11-10 ENCOUNTER — Encounter: Payer: Self-pay | Admitting: Obstetrics and Gynecology

## 2016-11-14 ENCOUNTER — Encounter: Payer: Self-pay | Admitting: Obstetrics and Gynecology

## 2016-11-14 ENCOUNTER — Ambulatory Visit (INDEPENDENT_AMBULATORY_CARE_PROVIDER_SITE_OTHER): Payer: Medicaid Other | Admitting: Obstetrics and Gynecology

## 2016-11-14 VITALS — BP 94/61 | HR 83 | Wt 180.5 lb

## 2016-11-14 DIAGNOSIS — O4440 Low lying placenta NOS or without hemorrhage, unspecified trimester: Secondary | ICD-10-CM

## 2016-11-14 DIAGNOSIS — Z23 Encounter for immunization: Secondary | ICD-10-CM

## 2016-11-14 DIAGNOSIS — Z3493 Encounter for supervision of normal pregnancy, unspecified, third trimester: Secondary | ICD-10-CM

## 2016-11-14 LAB — POCT URINALYSIS DIPSTICK
Bilirubin, UA: NEGATIVE
Glucose, UA: NEGATIVE
Ketones, UA: NEGATIVE
LEUKOCYTES UA: NEGATIVE
NITRITE UA: NEGATIVE
PH UA: 7 (ref 5.0–8.0)
RBC UA: NEGATIVE
Spec Grav, UA: 1.015 (ref 1.010–1.025)
Urobilinogen, UA: 0.2 E.U./dL

## 2016-11-14 NOTE — Progress Notes (Signed)
ROB- Pt would like to discuss her placenta, states she never was able to get u/s, she does have some pelvic pressure, Declines-flu, would like tdap today

## 2016-11-14 NOTE — Progress Notes (Signed)
ROB: No complaints.  Ultrasound scheduled for follow-up of low-lying placenta.  Patient could not do 1 hour GCT today will schedule next Monday with ultrasound.

## 2016-11-18 ENCOUNTER — Other Ambulatory Visit: Payer: Self-pay | Admitting: Obstetrics and Gynecology

## 2016-11-18 DIAGNOSIS — O4440 Low lying placenta NOS or without hemorrhage, unspecified trimester: Secondary | ICD-10-CM

## 2016-11-21 ENCOUNTER — Ambulatory Visit: Payer: Medicaid Other

## 2016-11-21 DIAGNOSIS — Z3493 Encounter for supervision of normal pregnancy, unspecified, third trimester: Secondary | ICD-10-CM

## 2016-11-21 DIAGNOSIS — O4440 Low lying placenta NOS or without hemorrhage, unspecified trimester: Secondary | ICD-10-CM

## 2016-11-22 LAB — CBC
Hematocrit: 34.9 % (ref 34.0–46.6)
Hemoglobin: 11.5 g/dL (ref 11.1–15.9)
MCH: 30.7 pg (ref 26.6–33.0)
MCHC: 33 g/dL (ref 31.5–35.7)
MCV: 93 fL (ref 79–97)
PLATELETS: 187 10*3/uL (ref 150–379)
RBC: 3.74 x10E6/uL — AB (ref 3.77–5.28)
RDW: 12.5 % (ref 12.3–15.4)
WBC: 9 10*3/uL (ref 3.4–10.8)

## 2016-11-22 LAB — GLUCOSE, 1 HOUR GESTATIONAL: Gestational Diabetes Screen: 145 mg/dL — ABNORMAL HIGH (ref 65–139)

## 2016-11-25 ENCOUNTER — Encounter: Payer: Self-pay | Admitting: Obstetrics and Gynecology

## 2016-11-28 ENCOUNTER — Ambulatory Visit (INDEPENDENT_AMBULATORY_CARE_PROVIDER_SITE_OTHER): Payer: Medicaid Other | Admitting: Obstetrics and Gynecology

## 2016-11-28 ENCOUNTER — Encounter: Payer: Self-pay | Admitting: Obstetrics and Gynecology

## 2016-11-28 ENCOUNTER — Other Ambulatory Visit: Payer: Medicaid Other

## 2016-11-28 VITALS — BP 95/58 | HR 80 | Wt 184.1 lb

## 2016-11-28 DIAGNOSIS — O26843 Uterine size-date discrepancy, third trimester: Secondary | ICD-10-CM

## 2016-11-28 DIAGNOSIS — Z3483 Encounter for supervision of other normal pregnancy, third trimester: Secondary | ICD-10-CM

## 2016-11-28 LAB — POCT URINALYSIS DIPSTICK
Bilirubin, UA: NEGATIVE
Glucose, UA: NEGATIVE
Ketones, UA: NEGATIVE
Leukocytes, UA: NEGATIVE
NITRITE UA: NEGATIVE
PH UA: 7.5 (ref 5.0–8.0)
PROTEIN UA: NEGATIVE
RBC UA: NEGATIVE
Spec Grav, UA: 1.01 (ref 1.010–1.025)
UROBILINOGEN UA: 0.2 U/dL

## 2016-11-28 NOTE — Progress Notes (Signed)
ROB:  Pt doing 3 hr GTT Friday.  Size less dates today - follow - consider U/S after next visit if size continues to lag.  (Baby is carrying low)

## 2016-12-02 ENCOUNTER — Other Ambulatory Visit: Payer: Medicaid Other

## 2016-12-02 NOTE — Addendum Note (Signed)
Addended by: Brooke DareSICK, Evynn Boutelle L on: 12/02/2016 11:37 AM   Modules accepted: Orders

## 2016-12-03 LAB — GESTATIONAL GLUCOSE TOLERANCE
GLUCOSE 2 HOUR GTT: 118 mg/dL (ref 65–154)
GLUCOSE FASTING: 87 mg/dL (ref 65–94)
Glucose, GTT - 1 Hour: 157 mg/dL (ref 65–179)
Glucose, GTT - 3 Hour: 67 mg/dL (ref 65–139)

## 2016-12-05 ENCOUNTER — Encounter: Payer: Self-pay | Admitting: Obstetrics and Gynecology

## 2016-12-13 ENCOUNTER — Encounter: Payer: Self-pay | Admitting: Obstetrics and Gynecology

## 2016-12-13 ENCOUNTER — Ambulatory Visit (INDEPENDENT_AMBULATORY_CARE_PROVIDER_SITE_OTHER): Payer: Medicaid Other | Admitting: Obstetrics and Gynecology

## 2016-12-13 VITALS — BP 100/58 | HR 101 | Wt 186.1 lb

## 2016-12-13 DIAGNOSIS — Z3483 Encounter for supervision of other normal pregnancy, third trimester: Secondary | ICD-10-CM

## 2016-12-13 DIAGNOSIS — R8290 Unspecified abnormal findings in urine: Secondary | ICD-10-CM

## 2016-12-13 DIAGNOSIS — O2342 Unspecified infection of urinary tract in pregnancy, second trimester: Secondary | ICD-10-CM

## 2016-12-13 LAB — POCT URINALYSIS DIPSTICK
Bilirubin, UA: NEGATIVE
GLUCOSE UA: NEGATIVE
Ketones, UA: NEGATIVE
NITRITE UA: NEGATIVE
Spec Grav, UA: >=1.03 — AB (ref 1.010–1.025)
UROBILINOGEN UA: 0.2 U/dL
pH, UA: 6.5 (ref 5.0–8.0)

## 2016-12-13 MED ORDER — NITROFURANTOIN MONOHYD MACRO 100 MG PO CAPS
100.0000 mg | ORAL_CAPSULE | Freq: Every day | ORAL | 3 refills | Status: DC
Start: 2016-12-13 — End: 2017-01-31

## 2016-12-13 NOTE — Progress Notes (Signed)
ROB: Doing well, no complaints.  Reassured that growth was normal.  Urine with odor, will culture. Not taking Macrobid prophylaxis as prescribed as she had no further refills at the pharmacy.  Will prescribe. RTC in 2 weeks.

## 2016-12-13 NOTE — Progress Notes (Signed)
ROB- Pt states she is doing well, she is just concerned that she is measuring small in size and she is almost done with her pregnancy,

## 2016-12-15 LAB — URINE CULTURE

## 2016-12-23 ENCOUNTER — Encounter: Payer: Self-pay | Admitting: Obstetrics and Gynecology

## 2016-12-27 ENCOUNTER — Ambulatory Visit (INDEPENDENT_AMBULATORY_CARE_PROVIDER_SITE_OTHER): Payer: Medicaid Other | Admitting: Obstetrics and Gynecology

## 2016-12-27 ENCOUNTER — Encounter: Payer: Self-pay | Admitting: Obstetrics and Gynecology

## 2016-12-27 VITALS — BP 92/55 | HR 93 | Wt 187.1 lb

## 2016-12-27 DIAGNOSIS — Z3483 Encounter for supervision of other normal pregnancy, third trimester: Secondary | ICD-10-CM

## 2016-12-27 LAB — POCT URINALYSIS DIPSTICK
Bilirubin, UA: NEGATIVE
GLUCOSE UA: NEGATIVE
Ketones, UA: NEGATIVE
Leukocytes, UA: NEGATIVE
NITRITE UA: NEGATIVE
PH UA: 6.5 (ref 5.0–8.0)
PROTEIN UA: NEGATIVE
RBC UA: NEGATIVE
SPEC GRAV UA: 1.02 (ref 1.010–1.025)
UROBILINOGEN UA: 0.2 U/dL

## 2016-12-27 NOTE — Progress Notes (Signed)
ROB: Signs and symptoms of labor discussed.  Patient denies problems.  Starting to become uncomfortable with pregnancy.  Needs GBS GC/CT next visit.

## 2017-01-10 ENCOUNTER — Ambulatory Visit (INDEPENDENT_AMBULATORY_CARE_PROVIDER_SITE_OTHER): Payer: Medicaid Other | Admitting: Obstetrics and Gynecology

## 2017-01-10 ENCOUNTER — Encounter: Payer: Medicaid Other | Admitting: Obstetrics and Gynecology

## 2017-01-10 ENCOUNTER — Encounter: Payer: Self-pay | Admitting: Obstetrics and Gynecology

## 2017-01-10 VITALS — BP 107/61 | HR 98 | Wt 188.0 lb

## 2017-01-10 DIAGNOSIS — Z3483 Encounter for supervision of other normal pregnancy, third trimester: Secondary | ICD-10-CM

## 2017-01-10 LAB — POCT URINALYSIS DIPSTICK
Bilirubin, UA: NEGATIVE
Glucose, UA: NEGATIVE
LEUKOCYTES UA: NEGATIVE
NITRITE UA: NEGATIVE
PH UA: 6.5 (ref 5.0–8.0)
SPEC GRAV UA: 1.02 (ref 1.010–1.025)
Urobilinogen, UA: 0.2 E.U./dL

## 2017-01-10 NOTE — Progress Notes (Signed)
ROB: Patient states she has been under some stress and has a cold and may be has not been eating as well as she should.  We have discussed this and she will work on doing better.  Reports active fetal movement.  Continues to experience some pelvic discomfort.  GC/CT-GBS performed.

## 2017-01-12 ENCOUNTER — Encounter: Payer: Self-pay | Admitting: Obstetrics and Gynecology

## 2017-01-13 LAB — STREP GP B NAA: STREP GROUP B AG: NEGATIVE

## 2017-01-14 LAB — GC/CHLAMYDIA PROBE AMP
CHLAMYDIA, DNA PROBE: NEGATIVE
Neisseria gonorrhoeae by PCR: NEGATIVE

## 2017-01-19 ENCOUNTER — Encounter: Payer: Self-pay | Admitting: Obstetrics and Gynecology

## 2017-01-23 ENCOUNTER — Encounter: Payer: Self-pay | Admitting: Obstetrics and Gynecology

## 2017-01-24 NOTE — L&D Delivery Note (Signed)
1510 In room to see patient, reports pelvic pressure and urge to push. SVE: 10/100/+2, vertex.   Spontaneous vaginal birth of liveborn female infant at 301520. Loose nuchal cord x 1 reduced on perineum. Infant immediately to maternal abdomen. Delayed cord clamping. Three (3) vessel cord. APGARs: 6, 9. Weight: 4130 g (9 lbs 2 oz). Receiving nurse present at bedside for birth.   Large gush noted. Pitocin infusing. Spontaneous delivery of intact placenta at 1524. Second degree perineal laceration repaired with 3-0 vicryl rapide and local anesthesia. Laceration well approximated and hemostatic. Uterus firm. Vault check completed. Counts correct x 2. QBL: 420 ml.  Mom to postpartum.  Baby to Couplet care / Skin to Skin. Initiate routine postpartum care and orders.    Gunnar BullaJenkins Michelle Kaydence Menard, CNM [facility], [facility] 02/06/2017, 4:46 PM

## 2017-01-26 ENCOUNTER — Telehealth: Payer: Self-pay

## 2017-01-26 NOTE — Telephone Encounter (Signed)
Unable to contact pt via telephone- rang once then hung up. Need to change appt time from 1:45 to 1030. Requested a call back to confirm.

## 2017-01-26 NOTE — Telephone Encounter (Signed)
mychart message

## 2017-01-26 NOTE — Telephone Encounter (Signed)
Attempted to contact pt- phone rang once, picked up then disconnected.

## 2017-01-27 ENCOUNTER — Encounter: Payer: Medicaid Other | Admitting: Obstetrics and Gynecology

## 2017-01-31 ENCOUNTER — Other Ambulatory Visit: Payer: Self-pay

## 2017-01-31 ENCOUNTER — Ambulatory Visit (INDEPENDENT_AMBULATORY_CARE_PROVIDER_SITE_OTHER): Payer: Medicaid Other | Admitting: Obstetrics and Gynecology

## 2017-01-31 ENCOUNTER — Encounter: Payer: Self-pay | Admitting: Obstetrics and Gynecology

## 2017-01-31 ENCOUNTER — Observation Stay
Admission: EM | Admit: 2017-01-31 | Discharge: 2017-01-31 | Disposition: A | Discharge status: Home / Self Care | Payer: Medicaid Other | Attending: Obstetrics and Gynecology | Admitting: Obstetrics and Gynecology

## 2017-01-31 VITALS — BP 102/65 | HR 99 | Wt 195.5 lb

## 2017-01-31 DIAGNOSIS — Z3A39 39 weeks gestation of pregnancy: Secondary | ICD-10-CM | POA: Insufficient documentation

## 2017-01-31 DIAGNOSIS — Z3483 Encounter for supervision of other normal pregnancy, third trimester: Secondary | ICD-10-CM

## 2017-01-31 DIAGNOSIS — O479 False labor, unspecified: Secondary | ICD-10-CM | POA: Diagnosis present

## 2017-01-31 LAB — POCT URINALYSIS DIPSTICK
Bilirubin, UA: NEGATIVE
Blood, UA: NEGATIVE
Glucose, UA: NEGATIVE
KETONES UA: NEGATIVE
Leukocytes, UA: NEGATIVE
NITRITE UA: NEGATIVE
PH UA: 7 (ref 5.0–8.0)
PROTEIN UA: NEGATIVE
SPEC GRAV UA: 1.01 (ref 1.010–1.025)
UROBILINOGEN UA: 0.2 U/dL

## 2017-01-31 NOTE — Discharge Instructions (Signed)
Drink plenty of fluid and get plenty of rest. Call provider for any other concerns.

## 2017-01-31 NOTE — Final Progress Note (Signed)
L&D OB Triage Note  SUBJECTIVE Kara Moses is a 27 y.o. 382P1001 female at 4652w3d, EDD Estimated Date of Delivery: 02/04/17 who presented to triage with complaints of contractions and decreased FM.  No vaginal bleeding. No ROM No Pre E symptoms   OBJECTIVE Nursing Evaluation: BP 112/66 (BP Location: Left Arm)   Pulse 94   Temp 98 F (36.7 C) (Oral)   Resp 16   LMP 04/26/2016 (Approximate)  no significant findings for labor  NST was performed and has been reviewed by me.  NST INTERPRETATION: Indications: rule out uterine contractions  Mode: External Baseline Rate (A): 135 bpm Variability: Moderate Accelerations: 15 x 15 Decelerations: None     Contraction Frequency (min): Irregular  ASSESSMENT Impression:  1. Pregnancy:  G2P1001 at 6952w3d , EDD Estimated Date of Delivery: 02/04/17 2.  Reactive NST  PLAN 1. Reassurance given 2. Discharge home with bleeding/labor precautions.  3. Continue routine prenatal care.   Herold HarmsMartin A Defrancesco, MD

## 2017-01-31 NOTE — Progress Notes (Signed)
ROB:  S&S.  BPP next week.  Induce next week if not spontaneous labor.

## 2017-01-31 NOTE — OB Triage Note (Signed)
Presents to L&D for c/o vaginal spotting seen on toilet paper.  Patient seen in the office today by Dr. Logan BoresEvans, SVE revealed 3cm.  Patient states she went home after appointment, took a nap and when she woke up and went to restroom noticed the vaginal spotting.  Patient states baby is moving well, she is having irregular contractions, and denies any leaking of fluid, headache or blurred vision.  Patient also states she has had some nausea.

## 2017-02-01 ENCOUNTER — Other Ambulatory Visit: Payer: Self-pay | Admitting: Obstetrics and Gynecology

## 2017-02-01 DIAGNOSIS — O480 Post-term pregnancy: Secondary | ICD-10-CM

## 2017-02-02 ENCOUNTER — Encounter: Payer: Self-pay | Admitting: *Deleted

## 2017-02-02 ENCOUNTER — Observation Stay
Admission: EM | Admit: 2017-02-02 | Discharge: 2017-02-03 | Disposition: A | Discharge status: Home / Self Care | Payer: Medicaid Other | Attending: Obstetrics and Gynecology | Admitting: Obstetrics and Gynecology

## 2017-02-02 DIAGNOSIS — O26893 Other specified pregnancy related conditions, third trimester: Principal | ICD-10-CM | POA: Insufficient documentation

## 2017-02-02 DIAGNOSIS — Z3490 Encounter for supervision of normal pregnancy, unspecified, unspecified trimester: Secondary | ICD-10-CM

## 2017-02-02 DIAGNOSIS — Z3A39 39 weeks gestation of pregnancy: Secondary | ICD-10-CM | POA: Insufficient documentation

## 2017-02-02 HISTORY — DX: Other specified health status: Z78.9

## 2017-02-02 NOTE — OB Triage Note (Signed)
Recvd pt from ED. Pt c/o contractions every 3-4 for the past hour. No vaginal bleeding or LOF. Feeling baby move ok. No complications with pregnancy. No intercourse in the past 24 hours.

## 2017-02-03 DIAGNOSIS — Z3A39 39 weeks gestation of pregnancy: Secondary | ICD-10-CM

## 2017-02-03 DIAGNOSIS — Z3490 Encounter for supervision of normal pregnancy, unspecified, unspecified trimester: Secondary | ICD-10-CM

## 2017-02-03 DIAGNOSIS — O26893 Other specified pregnancy related conditions, third trimester: Secondary | ICD-10-CM | POA: Diagnosis not present

## 2017-02-03 DIAGNOSIS — O2690 Pregnancy related conditions, unspecified, unspecified trimester: Secondary | ICD-10-CM | POA: Diagnosis present

## 2017-02-03 NOTE — Discharge Instructions (Signed)
Come back if:  Big gush of fluid Decreased fetal movement Heavy vaginal bleeding Temp over 100.4 Contractions every 3-5 min lasting at least 1-2 hours that are stronger than the contractions you are having now  Get plenty of rest and stay well hydrated!

## 2017-02-03 NOTE — Discharge Summary (Signed)
    L&D OB Triage Note  SUBJECTIVE Kara Moses is a 27 y.o. G2P1001 female at 4647w6d, EDD Estimated Date of Delivery: 02/04/17 who presented to triage with complaints of contractions.   Obstetric History   G2   P1   T1   P0   A0   L1    SAB0   TAB0   Ectopic0   Multiple0   Live Births1     # Outcome Date GA Lbr Len/2nd Weight Sex Delivery Anes PTL Lv  2 Current           1 Term 10/22/14 3463w3d  7 lb 15 oz (3.6 kg) F Vag-Spont None  LIV     Name: Kara Moses,Kara Moses     Apgar1:  8                Apgar5: 9      No medications prior to admission.     OBJECTIVE  Nursing Evaluation:   BP 125/64   Pulse 78   Temp 98.3 F (36.8 C) (Oral)   Resp 16   LMP 04/26/2016 (Approximate)    Findings: Patient with no cervical change from office visit and after spending approximately an hour and a half in labor and delivery showed no further cervical change.  NST was performed and has been reviewed by me.  NST INTERPRETATION: Category I  Mode: External Baseline Rate (A): 135 bpm Variability: Moderate Accelerations: 15 x 15 Decelerations: None     Contraction Frequency (min): 1.5-5  ASSESSMENT Impression:  1.  Pregnancy:  G2P1001 at 7747w6d , EDD Estimated Date of Delivery: 02/04/17 2.  NST:  reactive  PLAN 1. Reassurance given 2. Discharge home with standard labor precautions given to return to L&D or call the office for problems. 3. Continue routine prenatal care.

## 2017-02-06 ENCOUNTER — Other Ambulatory Visit: Payer: Self-pay

## 2017-02-06 ENCOUNTER — Inpatient Hospital Stay
Admission: EM | Admit: 2017-02-06 | Discharge: 2017-02-08 | DRG: 807 | Disposition: A | Discharge status: Home / Self Care | Payer: Medicaid Other | Attending: Obstetrics and Gynecology | Admitting: Obstetrics and Gynecology

## 2017-02-06 DIAGNOSIS — Z3A40 40 weeks gestation of pregnancy: Secondary | ICD-10-CM | POA: Diagnosis not present

## 2017-02-06 DIAGNOSIS — O3663X0 Maternal care for excessive fetal growth, third trimester, not applicable or unspecified: Secondary | ICD-10-CM | POA: Diagnosis present

## 2017-02-06 DIAGNOSIS — Z87440 Personal history of urinary (tract) infections: Secondary | ICD-10-CM

## 2017-02-06 DIAGNOSIS — Z3483 Encounter for supervision of other normal pregnancy, third trimester: Secondary | ICD-10-CM | POA: Diagnosis present

## 2017-02-06 LAB — CBC
HEMATOCRIT: 35.2 % (ref 35.0–47.0)
HEMOGLOBIN: 11.6 g/dL — AB (ref 12.0–16.0)
MCH: 28.9 pg (ref 26.0–34.0)
MCHC: 33 g/dL (ref 32.0–36.0)
MCV: 87.5 fL (ref 80.0–100.0)
Platelets: 176 10*3/uL (ref 150–440)
RBC: 4.02 MIL/uL (ref 3.80–5.20)
RDW: 13.9 % (ref 11.5–14.5)
WBC: 11.8 10*3/uL — ABNORMAL HIGH (ref 3.6–11.0)

## 2017-02-06 LAB — TYPE AND SCREEN
ABO/RH(D): A POS
Antibody Screen: NEGATIVE

## 2017-02-06 MED ORDER — MISOPROSTOL 200 MCG PO TABS
ORAL_TABLET | ORAL | Status: AC
  Filled 2017-02-06: qty 4

## 2017-02-06 MED ORDER — OXYTOCIN 40 UNITS IN LACTATED RINGERS INFUSION - SIMPLE MED
2.5000 [IU]/h | INTRAVENOUS | Status: DC
  Administered 2017-02-06: 2.5 [IU]/h via INTRAVENOUS

## 2017-02-06 MED ORDER — ACETAMINOPHEN 325 MG PO TABS
650.0000 mg | ORAL_TABLET | ORAL | Status: DC | PRN
  Administered 2017-02-06 – 2017-02-08 (×5): 650 mg via ORAL
  Filled 2017-02-06 (×4): qty 2

## 2017-02-06 MED ORDER — OXYTOCIN 40 UNITS IN LACTATED RINGERS INFUSION - SIMPLE MED
INTRAVENOUS | Status: AC
  Filled 2017-02-06: qty 1000

## 2017-02-06 MED ORDER — ACETAMINOPHEN 325 MG PO TABS
650.0000 mg | ORAL_TABLET | ORAL | Status: DC | PRN
  Filled 2017-02-06: qty 2

## 2017-02-06 MED ORDER — BUTORPHANOL TARTRATE 1 MG/ML IJ SOLN
1.0000 mg | INTRAMUSCULAR | Status: DC | PRN
  Administered 2017-02-06: 1 mg via INTRAVENOUS
  Filled 2017-02-06: qty 1

## 2017-02-06 MED ORDER — BENZOCAINE-MENTHOL 20-0.5 % EX AERO: 1.0000 "application " | INHALATION_SPRAY | CUTANEOUS | Status: DC | PRN

## 2017-02-06 MED ORDER — SIMETHICONE 80 MG PO CHEW: 80.0000 mg | CHEWABLE_TABLET | ORAL | Status: DC | PRN

## 2017-02-06 MED ORDER — ONDANSETRON HCL 4 MG/2ML IJ SOLN: 4.0000 mg | Freq: Four times a day (QID) | INTRAMUSCULAR | Status: DC | PRN

## 2017-02-06 MED ORDER — SODIUM CHLORIDE 0.9 % IV SOLN: 2.0000 g | Freq: Once | INTRAVENOUS | Status: DC

## 2017-02-06 MED ORDER — OXYTOCIN 10 UNIT/ML IJ SOLN
INTRAMUSCULAR | Status: AC
  Filled 2017-02-06: qty 2

## 2017-02-06 MED ORDER — OXYTOCIN BOLUS FROM INFUSION
500.0000 mL | Freq: Once | INTRAVENOUS | Status: AC
  Administered 2017-02-06: 500 mL via INTRAVENOUS

## 2017-02-06 MED ORDER — LACTATED RINGERS IV SOLN
INTRAVENOUS | Status: DC
  Administered 2017-02-06: 1000 mL via INTRAVENOUS

## 2017-02-06 MED ORDER — TETANUS-DIPHTH-ACELL PERTUSSIS 5-2.5-18.5 LF-MCG/0.5 IM SUSP: 0.5000 mL | Freq: Once | INTRAMUSCULAR | Status: DC

## 2017-02-06 MED ORDER — LACTATED RINGERS IV SOLN: 500.0000 mL | INTRAVENOUS | Status: DC | PRN

## 2017-02-06 MED ORDER — SOD CITRATE-CITRIC ACID 500-334 MG/5ML PO SOLN: 30.0000 mL | ORAL | Status: DC | PRN

## 2017-02-06 MED ORDER — PRENATAL MULTIVITAMIN CH
1.0000 | ORAL_TABLET | Freq: Every day | ORAL | Status: DC
  Administered 2017-02-07 – 2017-02-08 (×2): 1 via ORAL
  Filled 2017-02-06 (×2): qty 1

## 2017-02-06 MED ORDER — LIDOCAINE HCL (PF) 1 % IJ SOLN
30.0000 mL | INTRAMUSCULAR | Status: AC | PRN
  Administered 2017-02-06: 30 mL via SUBCUTANEOUS

## 2017-02-06 MED ORDER — IBUPROFEN 600 MG PO TABS
ORAL_TABLET | ORAL | Status: AC
  Filled 2017-02-06: qty 1

## 2017-02-06 MED ORDER — OXYTOCIN 40 UNITS IN LACTATED RINGERS INFUSION - SIMPLE MED
2.5000 [IU]/h | INTRAVENOUS | Status: DC | PRN
  Filled 2017-02-06: qty 1000

## 2017-02-06 MED ORDER — CLINDAMYCIN PHOSPHATE 900 MG/50ML IV SOLN: 900.0000 mg | Freq: Once | INTRAVENOUS | Status: DC

## 2017-02-06 MED ORDER — OXYTOCIN 40 UNITS IN LACTATED RINGERS INFUSION - SIMPLE MED
1.0000 m[IU]/min | INTRAVENOUS | Status: DC
  Administered 2017-02-06: 2 m[IU]/min via INTRAVENOUS

## 2017-02-06 MED ORDER — ZOLPIDEM TARTRATE 5 MG PO TABS: 5.0000 mg | ORAL_TABLET | Freq: Every evening | ORAL | Status: DC | PRN

## 2017-02-06 MED ORDER — AMMONIA AROMATIC IN INHA
RESPIRATORY_TRACT | Status: AC
  Filled 2017-02-06: qty 10

## 2017-02-06 MED ORDER — TERBUTALINE SULFATE 1 MG/ML IJ SOLN: 0.2500 mg | Freq: Once | INTRAMUSCULAR | Status: DC | PRN

## 2017-02-06 MED ORDER — IBUPROFEN 600 MG PO TABS
600.0000 mg | ORAL_TABLET | Freq: Four times a day (QID) | ORAL | Status: DC
  Administered 2017-02-06 – 2017-02-08 (×7): 600 mg via ORAL
  Filled 2017-02-06 (×7): qty 1

## 2017-02-06 MED ORDER — DOCUSATE SODIUM 100 MG PO CAPS
100.0000 mg | ORAL_CAPSULE | Freq: Two times a day (BID) | ORAL | Status: DC
  Administered 2017-02-07 – 2017-02-08 (×4): 100 mg via ORAL
  Filled 2017-02-06 (×4): qty 1

## 2017-02-06 MED ORDER — DIPHENHYDRAMINE HCL 25 MG PO CAPS: 25.0000 mg | ORAL_CAPSULE | Freq: Four times a day (QID) | ORAL | Status: DC | PRN

## 2017-02-06 MED ORDER — OXYCODONE-ACETAMINOPHEN 5-325 MG PO TABS: 1.0000 | ORAL_TABLET | ORAL | Status: DC | PRN

## 2017-02-06 MED ORDER — LIDOCAINE HCL (PF) 1 % IJ SOLN
INTRAMUSCULAR | Status: AC
  Filled 2017-02-06: qty 30

## 2017-02-06 NOTE — Lactation Note (Signed)
This note was copied from a baby's chart. Lactation Consultation Note  Patient Name: Kara Moses CourseKhadijah Jirak ZOXWR'UToday's Date: 02/06/2017 Reason for consult: Other (Comment)(mom wants to bottlefeed formula) Infant having some difficulty latching and staying latched as mom has right inverted nipple, large round breasts and sucks on tongue but able to stay latched if breasts shaped to get deeper latch, mother stating I will just bottle feed as I was helping her, after attempt to breast feed on right nipple she stated she wanted to "just bottle feed formula"  After confirming this she was given a bottle of Gerber Goodstart formula and father of baby was feeding infant,  I informed mom that if she would like to try breastfeeding again later we would be happy to assist her.     Maternal Data Formula Feeding for Exclusion: Yes Reason for exclusion: Mother's choice to formula and breast feed on admission Does the patient have breastfeeding experience prior to this delivery?: Yes  Feeding Feeding Type: Bottle Fed - Formula Nipple Type: Slow - flow Length of feed: 5 min(off and on for 5 min., right inverted nipple, )  LATCH Score Latch: Repeated attempts needed to sustain latch, nipple held in mouth throughout feeding, stimulation needed to elicit sucking reflex.  Audible Swallowing: None  Type of Nipple: Inverted(right nipple inverted)  Comfort (Breast/Nipple): Soft / non-tender  Hold (Positioning): Full assist, staff holds infant at breast  LATCH Score: 3  Interventions Interventions: Breast feeding basics reviewed;Assisted with latch;Skin to skin;Hand express;Breast compression;Adjust position;Support pillows  Lactation Tools Discussed/Used WIC Program: No([facility]   Consult Status Consult Status: Follow-up Date: 02/06/17 Follow-up type: In-patient    Dyann KiefMarsha D Laquinta Hazell 02/06/2017, 4:43 PM

## 2017-02-06 NOTE — H&P (Signed)
History and Physical   HPI  Kara Moses is a 27 y.o. G2P1001 at [redacted]w[redacted]d Estimated Date of Delivery: 02/04/17 who is being admitted for  labor management   OB History  Obstetric History   G2   P1   T1   P0   A0   L1    SAB0   TAB0   Ectopic0   Multiple0   Live Births1     # Outcome Date GA Lbr Len/2nd Weight Sex Delivery Anes PTL Lv  2 Current           1 Term 10/22/14 [redacted]w[redacted]d  7 lb 15 oz (3.6 kg) F Vag-Spont None  LIV     Name: DABRIA, WADAS [facility]     Apgar1:  8                Apgar5: 9      PROBLEM LIST  Pregnancy complications or risks: Patient Active Problem List   Diagnosis Date Noted  . Normal labor 02/06/2017  . Pregnancy 02/03/2017  . Recurrent urinary tract infection affecting pregnancy in second trimester 08/31/2016  . Indication for care in labor or delivery 10/21/2014  . Irregular uterine contractions 10/21/2014  . Labor abnormality, antepartum 10/21/2014    Prenatal labs and studies: ABO, Rh: --/--/PENDING (01/14 1221) Antibody: PENDING (01/14 1221) Rubella: 4.89 (06/15 0000) RPR: Non Reactive (06/15 0000)  HBsAg: Negative (06/15 0000)  HIV: Non Reactive (06/15 0000)  ZOX:WRUEAVWU (12/18 1706)   Past Medical History:  Diagnosis Date  . Medical history non-contributory      Past Surgical History:  Procedure Laterality Date  . BREAST LUMPECTOMY    . brice tumor       Medications    Current Discharge Medication List    CONTINUE these medications which have NOT CHANGED   Details  acetaminophen (TYLENOL) 325 MG tablet Take 650 mg by mouth every 6 (six) hours as needed.    Prenat-FeFum-FePo-FA-DHA w/o A (PROVIDA DHA) 16-16-1.25-110 MG CAPS Take 16 mg by mouth daily. Qty: 30 capsule, Refills: 11   Associated Diagnoses: Supervision of normal intrauterine pregnancy in multigravida in second trimester         Allergies  Patient has no known allergies.  Review of Systems  Pertinent items noted in HPI and remainder of  comprehensive ROS otherwise negative.  Physical Exam  BP 115/70 (BP Location: Left Arm)   Pulse 92   Temp 98 F (36.7 C) (Oral)   Resp 18   LMP 04/26/2016 (Approximate)   Lungs:  CTA B Cardio: RRR without M/R/G Abd: Soft, gravid, NT Presentation: cephalic EXT: No C/C/ 1+ Edema DTRs: 2+ B CERVIX: 5cm  See Prenatal records for more detailed PE.    FHR:  Variability: Good {> 6 bpm)  Toco: Uterine Contractions: Q2-4 min   Test Results  Results for orders placed or performed during the hospital encounter of 02/06/17 (from the past 24 hour(s))  CBC     Status: Abnormal   Collection Time: 02/06/17 12:21 PM  Result Value Ref Range   WBC 11.8 (H) 3.6 - 11.0 K/uL   RBC 4.02 3.80 - 5.20 MIL/uL   Hemoglobin 11.6 (L) 12.0 - 16.0 g/dL   HCT 98.1 19.1 - 47.8 %   MCV 87.5 80.0 - 100.0 fL   MCH 28.9 26.0 - 34.0 pg   MCHC 33.0 32.0 - 36.0 g/dL   RDW 29.5 62.1 - 30.8 %   Platelets 176 150 - 440 K/uL  Type and screen [facility]     Status: None (Preliminary result)   Collection Time: 02/06/17 12:21 PM  Result Value Ref Range   ABO/RH(D) PENDING    Antibody Screen PENDING    Sample Expiration      02/09/2017 Performed at [facility], [address] [place], [place] [address]      Assessment   G2P1001 at 4672w2d Estimated Date of Delivery: 02/04/17  The fetus is reassuring.   Patient Active Problem List   Diagnosis Date Noted  . Normal labor 02/06/2017  . Pregnancy 02/03/2017  . Recurrent urinary tract infection affecting pregnancy in second trimester 08/31/2016  . Indication for care in labor or delivery 10/21/2014  . Irregular uterine contractions 10/21/2014  . Labor abnormality, antepartum 10/21/2014    Plan  1. Admit to L&D :   anticipate vaginal delivery 2. EFM: -- Category 1 3. Epidural if desired. Stadol for IV pain until epidural requested. 4. Admission labs    Elonda Huskyavid J. Keshun Berrett, M.D. 02/06/2017 1:38 PM

## 2017-02-07 LAB — RPR: RPR Ser Ql: NONREACTIVE

## 2017-02-07 NOTE — Plan of Care (Signed)
Vs stable; up ad lib; taking motrin and tylenol for pain control; able to void; tolerating regular diet

## 2017-02-07 NOTE — Progress Notes (Signed)
Patient ID: Kara Moses, female   DOB: 1990-04-29, 27 y.o.   MRN: [id number]  Progress Note - Vaginal Delivery  Kara Moses is a 27 y.o. G2P2002 now PP day 1 s/p Vaginal, Spontaneous .   Subjective:  The patient reports no complaints, up ad lib, voiding and tolerating PO  Objective:  Vital signs in last 24 hours: Temp:  [97.5 F (36.4 C)-98.5 F (36.9 C)] 98.1 F (36.7 C) (01/15 0448) Pulse Rate:  [71-92] 71 (01/15 0448) Resp:  [18] 18 (01/15 0448) BP: (100-123)/(54-70) 100/57 (01/15 0448) SpO2:  [98 %-100 %] 98 % (01/15 0448) Weight:  [195 lb (88.5 kg)] 195 lb (88.5 kg) (01/15 0507)  Physical Exam:  General: alert and cooperative Lochia: appropriate Uterine Fundus: firm DVT Evaluation: No evidence of DVT seen on physical exam.    Data Review Recent Labs    02/06/17 1221  HGB 11.6*  HCT 35.2    Assessment/Plan: Active Problems:   Normal labor   Plan for discharge tomorrow  -- Continue routine PP care.     Elonda Huskyavid J. Evans, M.D. 02/07/2017 8:38 AM

## 2017-02-08 ENCOUNTER — Encounter: Payer: Medicaid Other | Admitting: Obstetrics and Gynecology

## 2017-02-08 ENCOUNTER — Other Ambulatory Visit: Payer: Medicaid Other

## 2017-02-08 NOTE — Plan of Care (Signed)
Vs stable; up ad lib; regular diet; taking motrin and tylenol for pain control; probable discharge today

## 2017-02-08 NOTE — Discharge Summary (Signed)
                              Discharge Summary  Date of Admission: 02/06/2017  Date of Discharge: 02/08/2017  Admitting Diagnosis: Onset of Labor at 3660w2d  Mode of Delivery: normal Pitocin augmented vaginal delivery                 Discharge Diagnosis: Status post vaginal birth - fetal macrosomia    Intrapartum Procedures: Atificial rupture of membranes, epidural and pitocin augmentation   Post partum procedures:   Complications: none                      Discharge Day SOAP Note:  Progress Note - Vaginal Delivery  Kara Moses is a 27 y.o. G2P2002 now PP day 2 s/p Vaginal, Spontaneous . Delivery was uncomplicated  Subjective  The patient has the following complaints: has no unusual complaints  Pain is controlled with current medications.   Patient is urinating without difficulty.  She is ambulating well.    Objective  Vital signs: BP 108/60 (BP Location: Left Arm)   Pulse 72   Temp 97.8 F (36.6 C) (Oral)   Resp 18   Ht 5\' 9"  (1.753 m)   Wt 195 lb (88.5 kg)   LMP 04/26/2016 (Approximate)   SpO2 99%   Breastfeeding? Unknown   BMI 28.80 kg/m   Physical Exam: Gen: NAD Fundus Fundal Tone: Firm  Lochia Amount: Small  Perineum Appearance: Edematous     Data Review Labs: CBC Latest Ref Rng & Units 02/06/2017 11/21/2016 07/08/2016  WBC 3.6 - 11.0 K/uL 11.8(H) 9.0 7.8  Hemoglobin 12.0 - 16.0 g/dL 11.6(L) 11.5 10.6(L)  Hematocrit 35.0 - 47.0 % 35.2 34.9 32.8(L)  Platelets 150 - 440 K/uL 176 187 181   A POS  Assessment/Plan  Active Problems:   Normal labor    Plan for discharge today.   Discharge Instructions: Per After Visit Summary. Activity: Advance as tolerated. Pelvic rest for 6 weeks.  Also refer to After Visit Summary Diet: Regular Medications: Allergies as of 02/08/2017   No Known Allergies     Medication List    STOP taking these medications   acetaminophen 325 MG tablet Commonly known as:  TYLENOL     TAKE these medications    PROVIDA DHA 16-16-1.25-110 MG Caps Take 16 mg by mouth daily.      Outpatient follow up:  Follow-up Information    Linzie CollinEvans, Janos Shampine James, MD Follow up in 6 week(s).   Specialty:  Obstetrics and Gynecology Contact information: [address] [place] [address] [phone]          Postpartum contraception: Will discuss at first office visit post-partum  Discharged Condition: good  Discharged to: home  Newborn Data: Disposition:home with mother  Apgars: APGAR (1 MIN): 6   APGAR (5 MINS): 9   APGAR (10 MINS):    Baby Feeding: Bottle    Elonda Huskyavid J. Javoni Lucken, M.D. 02/08/2017 11:13 AM

## 2017-02-08 NOTE — Progress Notes (Signed)
Patient discharged home with infant. Discharge instructions and prescriptions given and reviewed with patient. Patient verbalized understanding. Escorted out by auxillary.  

## 2017-03-05 ENCOUNTER — Encounter: Payer: Self-pay | Admitting: Certified Nurse Midwife

## 2017-03-20 ENCOUNTER — Encounter: Payer: Self-pay | Admitting: Certified Nurse Midwife

## 2017-03-20 ENCOUNTER — Ambulatory Visit (INDEPENDENT_AMBULATORY_CARE_PROVIDER_SITE_OTHER): Payer: Medicaid Other | Admitting: Certified Nurse Midwife

## 2017-03-20 ENCOUNTER — Encounter: Payer: Medicaid Other | Admitting: Certified Nurse Midwife

## 2017-03-20 DIAGNOSIS — Z3009 Encounter for other general counseling and advice on contraception: Secondary | ICD-10-CM

## 2017-03-20 NOTE — Progress Notes (Signed)
Pt is doing well stopped breastfeeding due to baby not being able lacg. Thinking about starting depo starts again as bc.

## 2017-03-20 NOTE — Progress Notes (Signed)
Subjective:    Ellsworth LennoxKhadijah A Ebbert is a 27 y.o. 502P2002 African American female who presents for a postpartum visit. She is 6 weeks postpartum following a spontaneous vaginal delivery at 40+2 gestational weeks. Anesthesia: local. I have fully reviewed the prenatal and intrapartum course.   Postpartum course has been uncomplicated. Baby's course has been uncomplicated. Baby is feeding by Achille RichFomula; Gerber GoodStart. Bleeding periods have resumed. Bowel function is normal. Bladder function is normal.   Patient is sexually active. Last sexual activity: 01-20-1993. Contraception method is condoms. Postpartum depression screening: negative. Score 0.  Last pap 08/03/2016 and was ASCUS/HPV negative.  The following portions of the patient's history were reviewed and updated as appropriate: allergies, current medications, past medical history, past surgical history and problem list.  Review of Systems  Pertinent items are noted in HPI.   Objective:   BP 109/70   Pulse 75   Wt 168 lb 9.6 oz (76.5 kg)   LMP 03/17/2017   Breastfeeding? No   BMI 24.90 kg/m   General:  alert, cooperative and no distress   Breasts:  deferred, no complaints  Lungs: clear to auscultation bilaterally  Heart:  regular rate and rhythm  Abdomen: soft, nontender   Vulva: normal  Vagina: normal vagina  Cervix:  closed  Corpus: Well-involuted  Adnexa:  Non-palpable   Depression screen [facility] 2/9 03/20/2017  Decreased Interest 0  Down, Depressed, Hopeless 0  PHQ - 2 Score 0  Altered sleeping 0  Tired, decreased energy 0  Change in appetite 0  Feeling bad or failure about yourself  0  Trouble concentrating 0  Moving slowly or fidgety/restless 0  Suicidal thoughts 0  PHQ-9 Score 0    Assessment:   Postpartum exam Six (6) wks s/p vaginal birth Formula feeding Depression screening Contraception counseling   Plan:   Reviewed all forms of birth control options available including abstinence; fertility period  awareness methods; over the counter/barrier methods; hormonal contraceptive medication including pill, patch, ring, injection,contraceptive implant; hormonal and nonhormonal IUDs. Risks and benefits reviewed.  Questions were answered.  Information was given to patient to review.   Reviewed red flag symptoms and when to call.   RTC this week for IUD placement, if desired.  Follow up in: 1 year for Annual Exam or earlier if needed.   Gunnar BullaJenkins Michelle Leara Rawl, CNM [facility], [facility]

## 2017-03-20 NOTE — Patient Instructions (Signed)
Levonorgestrel intrauterine device (IUD) What is this medicine? LEVONORGESTREL IUD (LEE voe nor jes trel) is a contraceptive (birth control) device. The device is placed inside the uterus by a healthcare professional. It is used to prevent pregnancy. This device can also be used to treat heavy bleeding that occurs during your period. This medicine may be used for other purposes; ask your health care provider or pharmacist if you have questions. COMMON BRAND NAME(S): Minette Headland What should I tell my health care provider before I take this medicine? They need to know if you have any of these conditions: -abnormal Pap smear -cancer of the breast, uterus, or cervix -diabetes -endometritis -genital or pelvic infection now or in the past -have more than one sexual partner or your partner has more than one partner -heart disease -history of an ectopic or tubal pregnancy -immune system problems -IUD in place -liver disease or tumor -problems with blood clots or take blood-thinners -seizures -use intravenous drugs -uterus of unusual shape -vaginal bleeding that has not been explained -an unusual or allergic reaction to levonorgestrel, other hormones, silicone, or polyethylene, medicines, foods, dyes, or preservatives -pregnant or trying to get pregnant -breast-feeding How should I use this medicine? This device is placed inside the uterus by a health care professional. Talk to your pediatrician regarding the use of this medicine in children. Special care may be needed. Overdosage: If you think you have taken too much of this medicine contact a poison control center or emergency room at once. NOTE: This medicine is only for you. Do not share this medicine with others. What if I miss a dose? This does not apply. Depending on the brand of device you have inserted, the device will need to be replaced every 3 to 5 years if you wish to continue using this type of birth  control. What may interact with this medicine? Do not take this medicine with any of the following medications: -amprenavir -bosentan -fosamprenavir This medicine may also interact with the following medications: -aprepitant -armodafinil -barbiturate medicines for inducing sleep or treating seizures -bexarotene -boceprevir -griseofulvin -medicines to treat seizures like carbamazepine, ethotoin, felbamate, oxcarbazepine, phenytoin, topiramate -modafinil -pioglitazone -rifabutin -rifampin -rifapentine -some medicines to treat HIV infection like atazanavir, efavirenz, indinavir, lopinavir, nelfinavir, tipranavir, ritonavir -St. John's wort -warfarin This list may not describe all possible interactions. Give your health care provider a list of all the medicines, herbs, non-prescription drugs, or dietary supplements you use. Also tell them if you smoke, drink alcohol, or use illegal drugs. Some items may interact with your medicine. What should I watch for while using this medicine? Visit your doctor or health care professional for regular check ups. See your doctor if you or your partner has sexual contact with others, becomes HIV positive, or gets a sexual transmitted disease. This product does not protect you against HIV infection (AIDS) or other sexually transmitted diseases. You can check the placement of the IUD yourself by reaching up to the top of your vagina with clean fingers to feel the threads. Do not pull on the threads. It is a good habit to check placement after each menstrual period. Call your doctor right away if you feel more of the IUD than just the threads or if you cannot feel the threads at all. The IUD may come out by itself. You may become pregnant if the device comes out. If you notice that the IUD has come out use a backup birth control method like condoms and call your  health care provider. Using tampons will not change the position of the IUD and are okay to use  during your period. This IUD can be safely scanned with magnetic resonance imaging (MRI) only under specific conditions. Before you have an MRI, tell your healthcare provider that you have an IUD in place, and which type of IUD you have in place. What side effects may I notice from receiving this medicine? Side effects that you should report to your doctor or health care professional as soon as possible: -allergic reactions like skin rash, itching or hives, swelling of the face, lips, or tongue -fever, flu-like symptoms -genital sores -high blood pressure -no menstrual period for 6 weeks during use -pain, swelling, warmth in the leg -pelvic pain or tenderness -severe or sudden headache -signs of pregnancy -stomach cramping -sudden shortness of breath -trouble with balance, talking, or walking -unusual vaginal bleeding, discharge -yellowing of the eyes or skin Side effects that usually do not require medical attention (report to your doctor or health care professional if they continue or are bothersome): -acne -breast pain -change in sex drive or performance -changes in weight -cramping, dizziness, or faintness while the device is being inserted -headache -irregular menstrual bleeding within first 3 to 6 months of use -nausea This list may not describe all possible side effects. Call your doctor for medical advice about side effects. You may report side effects to FDA at 1-800-FDA-1088. Where should I keep my medicine? This does not apply. NOTE: This sheet is a summary. It may not cover all possible information. If you have questions about this medicine, talk to your doctor, pharmacist, or health care provider.  2018 Elsevier/Gold Standard (2015-10-23 14:14:56) Preventive Care 18-39 Years, Female Preventive care refers to lifestyle choices and visits with your health care provider that can promote health and wellness. What does preventive care include?  A yearly physical exam.  This is also called an annual well check.  Dental exams once or twice a year.  Routine eye exams. Ask your health care provider how often you should have your eyes checked.  Personal lifestyle choices, including: ? Daily care of your teeth and gums. ? Regular physical activity. ? Eating a healthy diet. ? Avoiding tobacco and drug use. ? Limiting alcohol use. ? Practicing safe sex. ? Taking vitamin and mineral supplements as recommended by your health care provider. What happens during an annual well check? The services and screenings done by your health care provider during your annual well check will depend on your age, overall health, lifestyle risk factors, and family history of disease. Counseling Your health care provider may ask you questions about your:  Alcohol use.  Tobacco use.  Drug use.  Emotional well-being.  Home and relationship well-being.  Sexual activity.  Eating habits.  Work and work environment.  Method of birth control.  Menstrual cycle.  Pregnancy history.  Screening You may have the following tests or measurements:  Height, weight, and BMI.  Diabetes screening. This is done by checking your blood sugar (glucose) after you have not eaten for a while (fasting).  Blood pressure.  Lipid and cholesterol levels. These may be checked every 5 years starting at age 20.  Skin check.  Hepatitis C blood test.  Hepatitis B blood test.  Sexually transmitted disease (STD) testing.  BRCA-related cancer screening. This may be done if you have a family history of breast, ovarian, tubal, or peritoneal cancers.  Pelvic exam and Pap test. This may be done every 3   years starting at age 14. Starting at age 51, this may be done every 5 years if you have a Pap test in combination with an HPV test.  Discuss your test results, treatment options, and if necessary, the need for more tests with your health care provider. Vaccines Your health care provider  may recommend certain vaccines, such as:  Influenza vaccine. This is recommended every year.  Tetanus, diphtheria, and acellular pertussis (Tdap, Td) vaccine. You may need a Td booster every 10 years.  Varicella vaccine. You may need this if you have not been vaccinated.  HPV vaccine. If you are 67 or younger, you may need three doses over 6 months.  Measles, mumps, and rubella (MMR) vaccine. You may need at least one dose of MMR. You may also need a second dose.  Pneumococcal 13-valent conjugate (PCV13) vaccine. You may need this if you have certain conditions and were not previously vaccinated.  Pneumococcal polysaccharide (PPSV23) vaccine. You may need one or two doses if you smoke cigarettes or if you have certain conditions.  Meningococcal vaccine. One dose is recommended if you are age 14-21 years and a first-year college student living in a residence hall, or if you have one of several medical conditions. You may also need additional booster doses.  Hepatitis A vaccine. You may need this if you have certain conditions or if you travel or work in places where you may be exposed to hepatitis A.  Hepatitis B vaccine. You may need this if you have certain conditions or if you travel or work in places where you may be exposed to hepatitis B.  Haemophilus influenzae type b (Hib) vaccine. You may need this if you have certain risk factors.  Talk to your health care provider about which screenings and vaccines you need and how often you need them. This information is not intended to replace advice given to you by your health care provider. Make sure you discuss any questions you have with your health care provider. Document Released: 03/08/2001 Document Revised: 09/30/2015 Document Reviewed: 11/11/2014 Elsevier Interactive Patient Education  [employer].

## 2017-03-23 ENCOUNTER — Ambulatory Visit: Payer: Medicaid Other | Admitting: Certified Nurse Midwife

## 2017-03-30 ENCOUNTER — Ambulatory Visit (INDEPENDENT_AMBULATORY_CARE_PROVIDER_SITE_OTHER): Payer: Medicaid Other | Admitting: Certified Nurse Midwife

## 2017-03-30 ENCOUNTER — Encounter: Payer: Self-pay | Admitting: Certified Nurse Midwife

## 2017-03-30 VITALS — BP 107/68 | HR 73 | Ht 69.0 in | Wt 164.7 lb

## 2017-03-30 DIAGNOSIS — Z30014 Encounter for initial prescription of intrauterine contraceptive device: Secondary | ICD-10-CM

## 2017-03-30 LAB — POCT URINE PREGNANCY: PREG TEST UR: NEGATIVE

## 2017-03-30 MED ORDER — LEVONORGESTREL 20 MCG/24HR IU IUD
INTRAUTERINE_SYSTEM | Freq: Once | INTRAUTERINE | Status: AC
  Administered 2017-03-30: 11:00:00 via INTRAUTERINE

## 2017-03-30 NOTE — Patient Instructions (Signed)
IUD PLACEMENT POST-PROCEDURE INSTRUCTIONS  1. You may take Ibuprofen, Aleve or Tylenol for pain if needed.  Cramping should resolve within in 24 hours.  2. You may have a small amount of spotting.  You should wear a mini pad for the next few days.  3. You may have intercourse after 72 hours.  If you using this for birth control, it is effective immediately.  4. You need to call if you have any pelvic pain, fever, heavy bleeding or foul smelling vaginal discharge.  Irregular bleeding is common the first several months after having an IUD placed. You do not need to call for this reason unless you are concerned.  5. Shower or bathe as normal  You should have a follow-up appointment in 4-8 weeks for a re-check to make sure you are not having any problems.Levonorgestrel intrauterine device (IUD) What is this medicine? LEVONORGESTREL IUD (LEE voe nor jes trel) is a contraceptive (birth control) device. The device is placed inside the uterus by a healthcare professional. It is used to prevent pregnancy. This device can also be used to treat heavy bleeding that occurs during your period. This medicine may be used for other purposes; ask your health care provider or pharmacist if you have questions. COMMON BRAND NAME(S): Kyleena, LILETTA, Mirena, Skyla What should I tell my health care provider before I take this medicine? They need to know if you have any of these conditions: -abnormal Pap smear -cancer of the breast, uterus, or cervix -diabetes -endometritis -genital or pelvic infection now or in the past -have more than one sexual partner or your partner has more than one partner -heart disease -history of an ectopic or tubal pregnancy -immune system problems -IUD in place -liver disease or tumor -problems with blood clots or take blood-thinners -seizures -use intravenous drugs -uterus of unusual shape -vaginal bleeding that has not been explained -an unusual or allergic reaction to  levonorgestrel, other hormones, silicone, or polyethylene, medicines, foods, dyes, or preservatives -pregnant or trying to get pregnant -breast-feeding How should I use this medicine? This device is placed inside the uterus by a health care professional. Talk to your pediatrician regarding the use of this medicine in children. Special care may be needed. Overdosage: If you think you have taken too much of this medicine contact a poison control center or emergency room at once. NOTE: This medicine is only for you. Do not share this medicine with others. What if I miss a dose? This does not apply. Depending on the brand of device you have inserted, the device will need to be replaced every 3 to 5 years if you wish to continue using this type of birth control. What may interact with this medicine? Do not take this medicine with any of the following medications: -amprenavir -bosentan -fosamprenavir This medicine may also interact with the following medications: -aprepitant -armodafinil -barbiturate medicines for inducing sleep or treating seizures -bexarotene -boceprevir -griseofulvin -medicines to treat seizures like carbamazepine, ethotoin, felbamate, oxcarbazepine, phenytoin, topiramate -modafinil -pioglitazone -rifabutin -rifampin -rifapentine -some medicines to treat HIV infection like atazanavir, efavirenz, indinavir, lopinavir, nelfinavir, tipranavir, ritonavir -St. John's wort -warfarin This list may not describe all possible interactions. Give your health care provider a list of all the medicines, herbs, non-prescription drugs, or dietary supplements you use. Also tell them if you smoke, drink alcohol, or use illegal drugs. Some items may interact with your medicine. What should I watch for while using this medicine? Visit your doctor or health care professional for regular   check ups. See your doctor if you or your partner has sexual contact with others, becomes HIV positive, or  gets a sexual transmitted disease. This product does not protect you against HIV infection (AIDS) or other sexually transmitted diseases. You can check the placement of the IUD yourself by reaching up to the top of your vagina with clean fingers to feel the threads. Do not pull on the threads. It is a good habit to check placement after each menstrual period. Call your doctor right away if you feel more of the IUD than just the threads or if you cannot feel the threads at all. The IUD may come out by itself. You may become pregnant if the device comes out. If you notice that the IUD has come out use a backup birth control method like condoms and call your health care provider. Using tampons will not change the position of the IUD and are okay to use during your period. This IUD can be safely scanned with magnetic resonance imaging (MRI) only under specific conditions. Before you have an MRI, tell your healthcare provider that you have an IUD in place, and which type of IUD you have in place. What side effects may I notice from receiving this medicine? Side effects that you should report to your doctor or health care professional as soon as possible: -allergic reactions like skin rash, itching or hives, swelling of the face, lips, or tongue -fever, flu-like symptoms -genital sores -high blood pressure -no menstrual period for 6 weeks during use -pain, swelling, warmth in the leg -pelvic pain or tenderness -severe or sudden headache -signs of pregnancy -stomach cramping -sudden shortness of breath -trouble with balance, talking, or walking -unusual vaginal bleeding, discharge -yellowing of the eyes or skin Side effects that usually do not require medical attention (report to your doctor or health care professional if they continue or are bothersome): -acne -breast pain -change in sex drive or performance -changes in weight -cramping, dizziness, or faintness while the device is being  inserted -headache -irregular menstrual bleeding within first 3 to 6 months of use -nausea This list may not describe all possible side effects. Call your doctor for medical advice about side effects. You may report side effects to FDA at 1-800-FDA-1088. Where should I keep my medicine? This does not apply. NOTE: This sheet is a summary. It may not cover all possible information. If you have questions about this medicine, talk to your doctor, pharmacist, or health care provider.  2018 Elsevier/Gold Standard (2015-10-23 14:14:56)  

## 2017-03-30 NOTE — Progress Notes (Signed)
Kara Moses is a 27 y.o. year old [id number] African American female who presents for placement of a Mirena IUD.  Patient's last menstrual period was 03/17/2017. BP 107/68   Pulse 73   Ht 5\' 9"  (1.753 m)   Wt 164 lb 11.2 oz (74.7 kg)   LMP 03/17/2017   BMI 24.32 kg/m  Last sexual intercourse was yesterday, and pregnancy test today was negative.  The risks and benefits of the method and placement have been thouroughly reviewed with the patient and all questions were answered.  Specifically the patient is aware of failure rate of 01/998, expulsion of the IUD and of possible perforation.  The patient is aware of irregular bleeding due to the method and understands the incidence of irregular bleeding diminishes with time.  Signed copy of informed consent in chart.   Time out was performed.  A small plastic speculum was placed in the vagina.  The cervix was visualized, prepped using Betadine, and grasped with a single tooth tenaculum. The uterus was found to be retroflexed and it sounded to 8 cm.  Mirena IUD placed per manufacturer's recommendations.   The strings were trimmed to 3 cm.  The patient was given post procedure instructions, including signs and symptoms of infection and to check for the strings after each menses or each month, and refraining from intercourse or anything in the vagina for 3 days.  She was given a Mirena care card with date Mirena placed, and date Mirena to be removed.    Gunnar BullaJenkins Michelle Anderson Coppock, CNM [facility], [facility]

## 2017-04-02 ENCOUNTER — Encounter: Payer: Self-pay | Admitting: Certified Nurse Midwife

## 2017-04-03 ENCOUNTER — Encounter: Payer: Self-pay | Admitting: Certified Nurse Midwife

## 2017-08-21 ENCOUNTER — Encounter: Payer: Self-pay | Admitting: Certified Nurse Midwife

## 2017-10-22 IMAGING — US US OB COMP LESS 14 WK
1 series · 14 of 28 positions shown · non-contrast
Comparison: None.

CLINICAL DATA: Vaginal bleeding in first trimester pregnancy.
Gestational age by LMP of 6 weeks 1 day.

EXAM:
OBSTETRIC <14 WK US AND TRANSVAGINAL OB US
TECHNIQUE: Both transabdominal and transvaginal ultrasound examinations were
performed for complete evaluation of the gestation as well as the
maternal uterus, adnexal regions, and pelvic cul-de-sac.
Transvaginal technique was performed to assess early pregnancy.

[Series 1: us ob comp less 14 wk · 0.08mm/px · 14 of 117 slices shown]
[im 5/117]
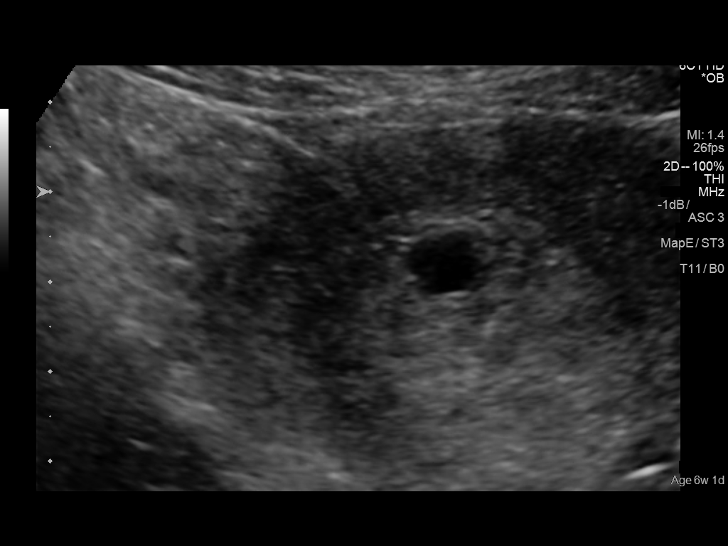
[im 13/117]
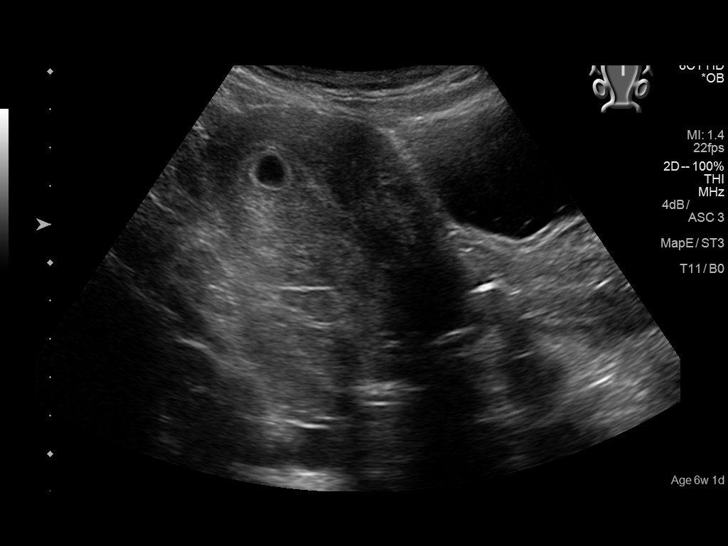
[im 22/117]
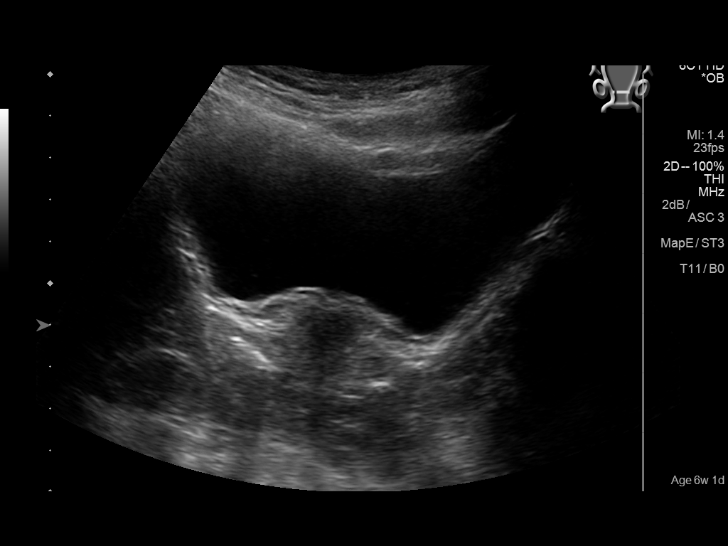
[im 31/117]
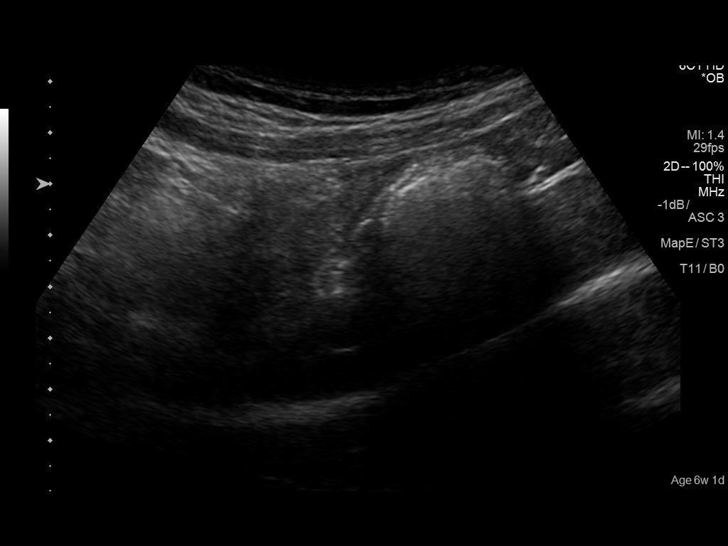
[im 39/117]
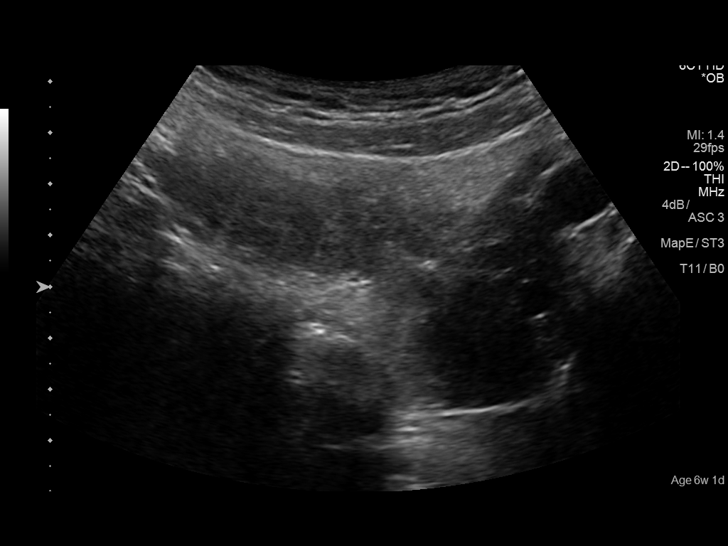
[im 48/117]
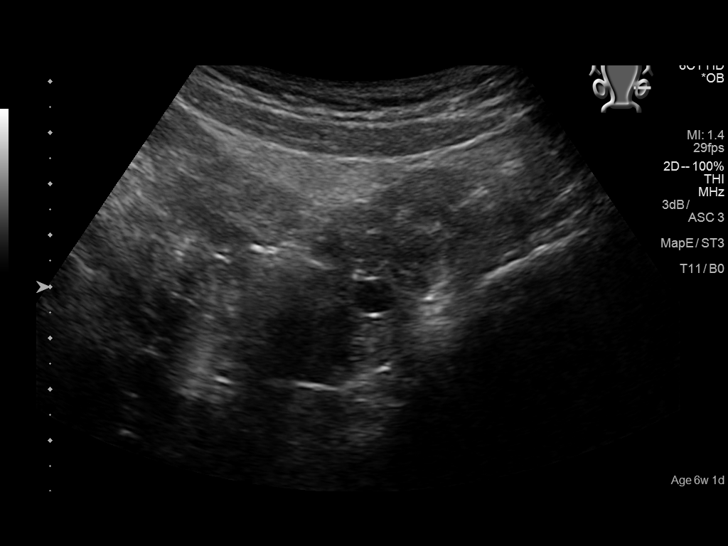
[im 56/117]
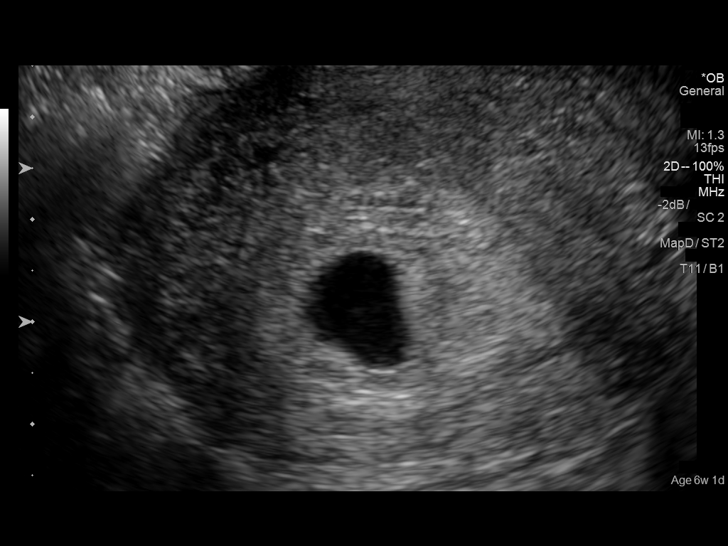
[im 65/117]
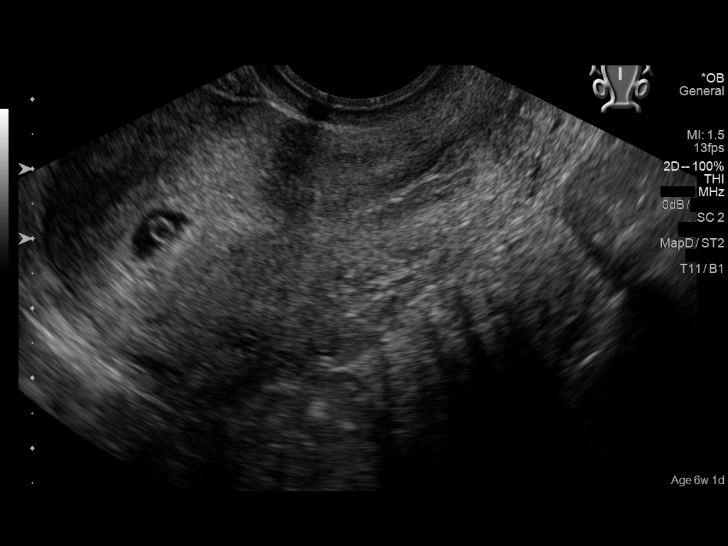
[im 74/117]
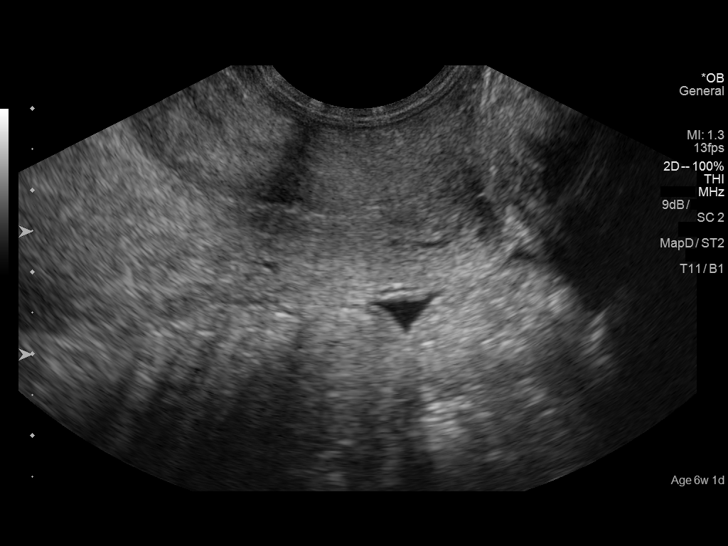
[im 82/117]
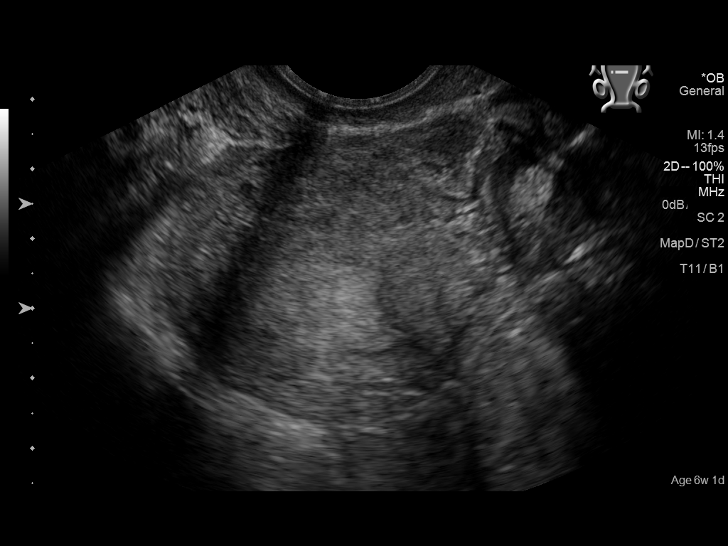
[im 91/117]
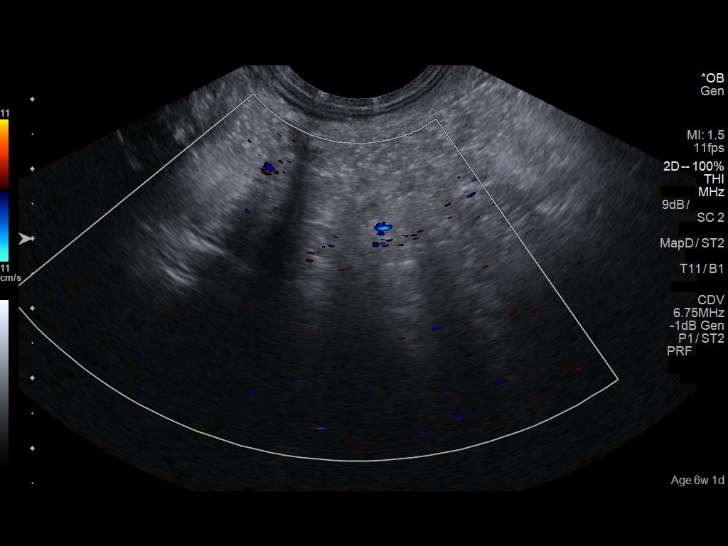
[im 99/117]
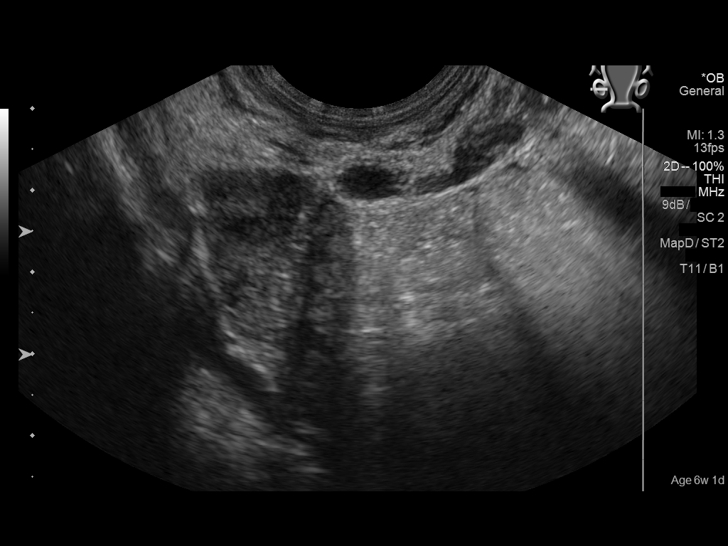
[im 108/117]
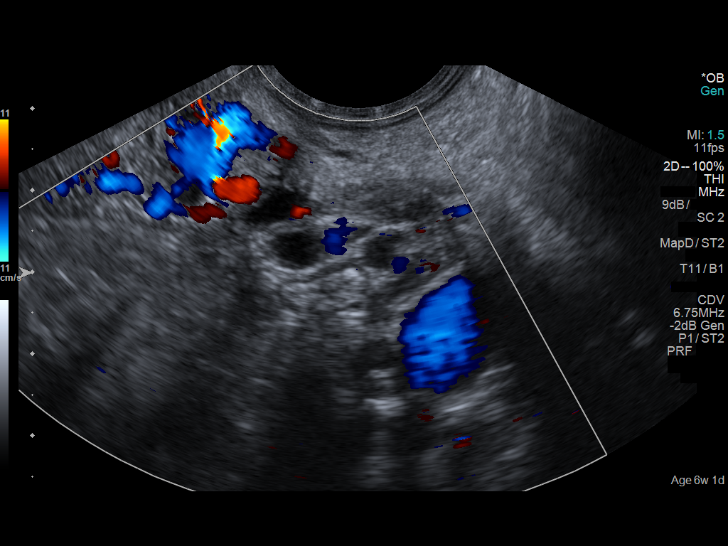
[im 117/117]
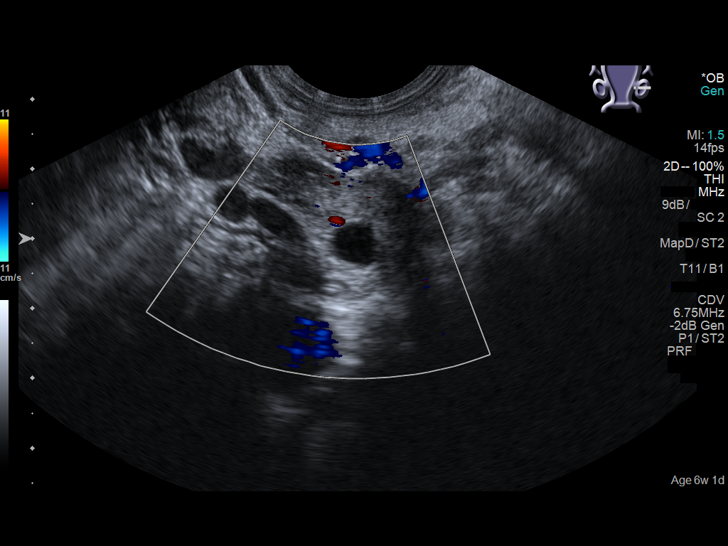

[14 of 28 positions shown; findings below may reference images not displayed]

FINDINGS: Intrauterine gestational sac: Single

Yolk sac:  Visualized.

Embryo:  Not Visualized.

MSD: 8  mm   5 w   4  d

Subchorionic hemorrhage:  None visualized.

Maternal uterus/adnexae: Normal appearance of both ovaries. No mass
or abnormal free fluid identified.
IMPRESSION: Single intrauterine gestational sac measuring 5 weeks 4 days by mean
sac diameter. Consider following quantitative beta HCG levels, with
followup ultrasound to assess viability in 10 days.

## 2017-11-01 IMAGING — US US OB COMP LESS 14 WK
1 series · 14 of 28 positions shown · non-contrast
Comparison: Five scratch set 06/08/2016

CLINICAL DATA: Vaginal bleeding.

EXAM:
OBSTETRIC <14 WK US AND TRANSVAGINAL OB US
TECHNIQUE: Both transabdominal and transvaginal ultrasound examinations were
performed for complete evaluation of the gestation as well as the
maternal uterus, adnexal regions, and pelvic cul-de-sac.
Transvaginal technique was performed to assess early pregnancy.

[Series 1: us ob comp less 14 wk · 0.13mm/px · 14 of 94 slices shown]
[im 4/94]
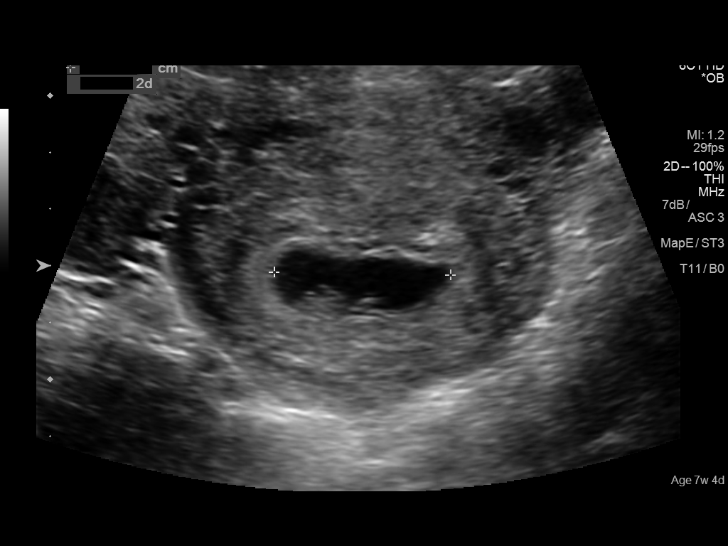
[im 11/94]
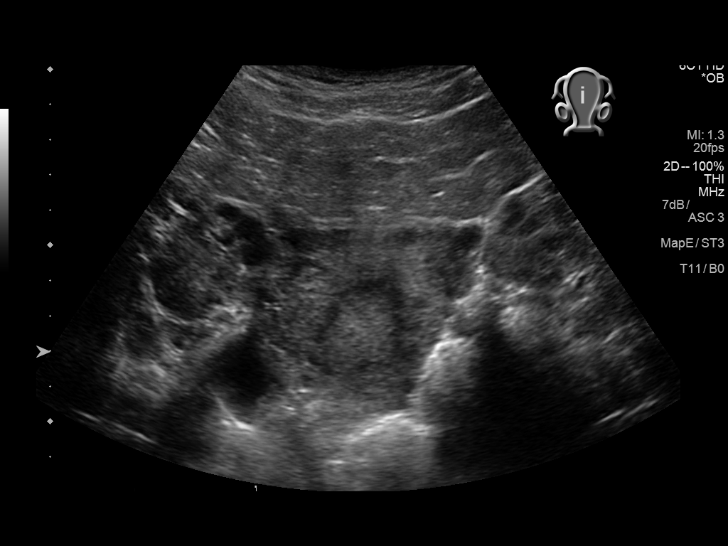
[im 18/94]
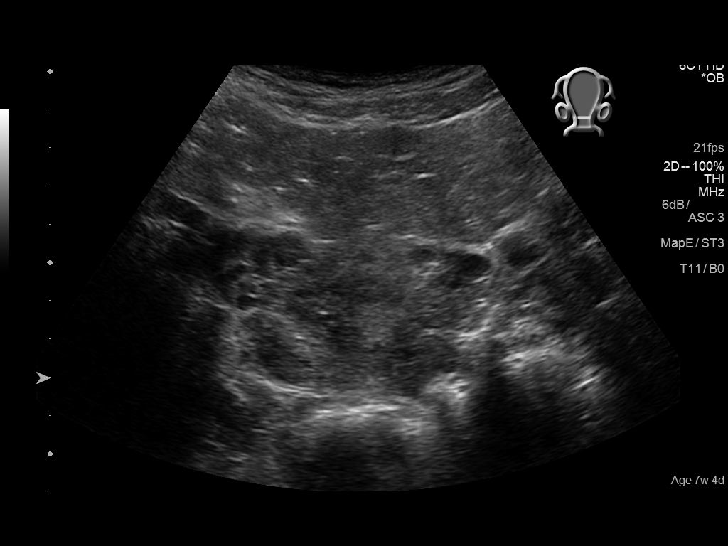
[im 25/94]
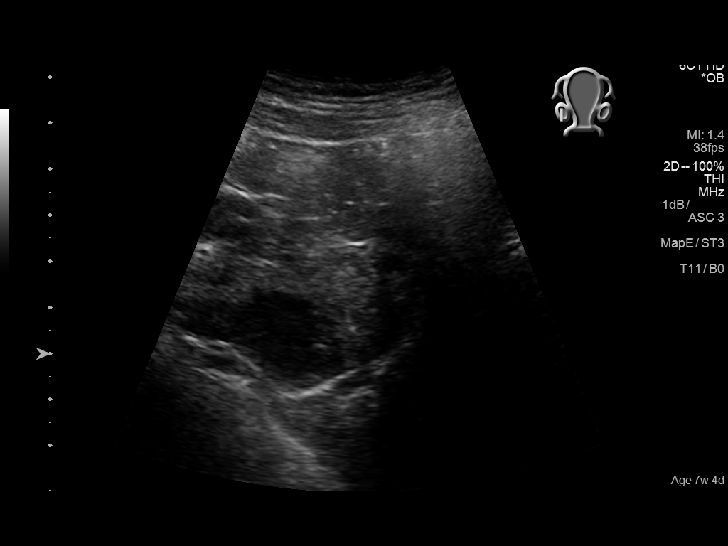
[im 32/94]
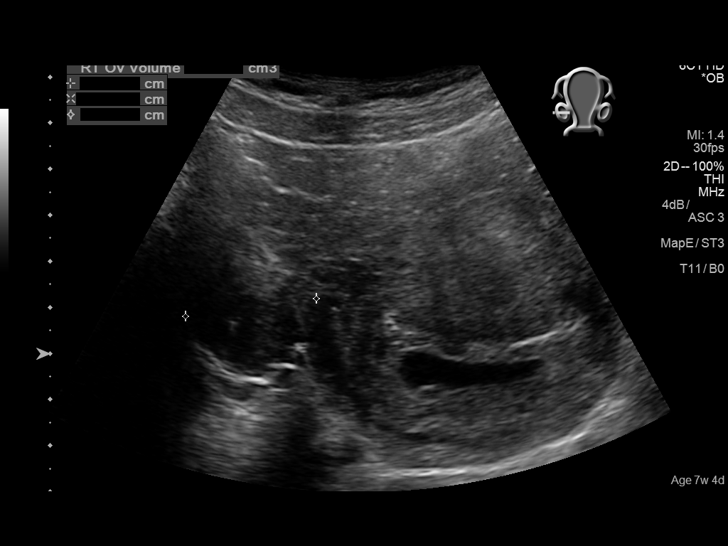
[im 38/94]
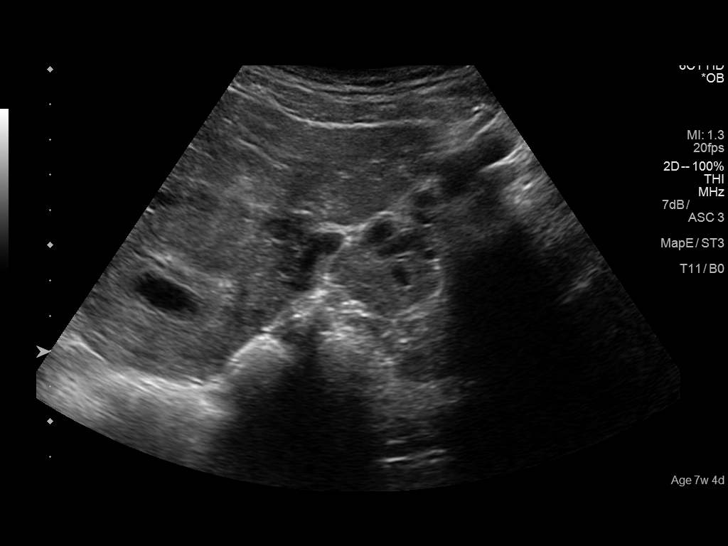
[im 45/94]
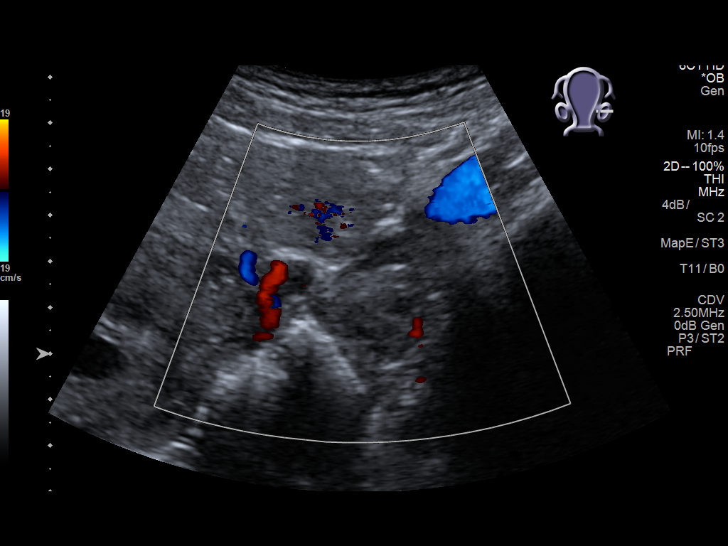
[im 52/94]
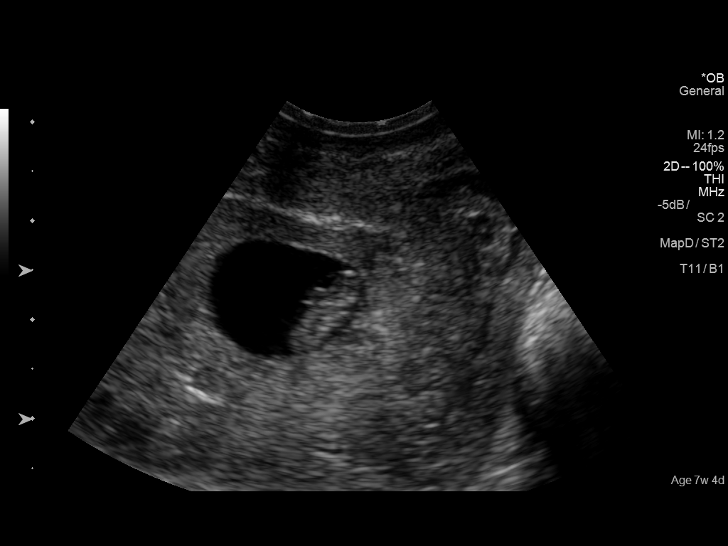
[im 59/94]
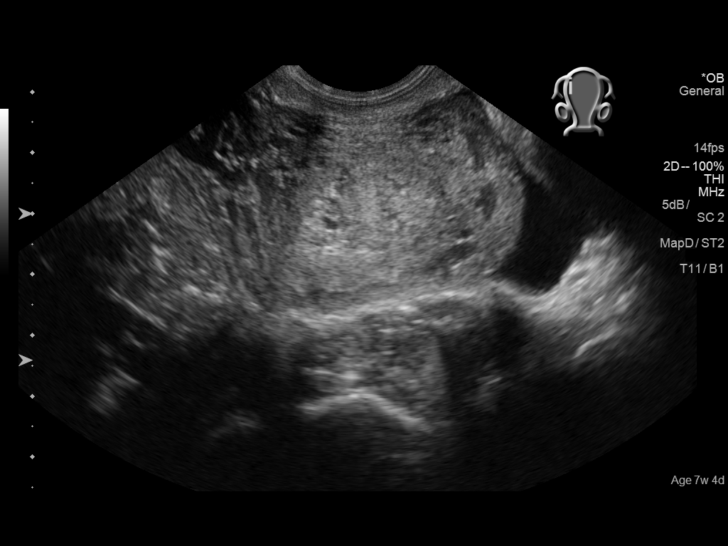
[im 66/94]
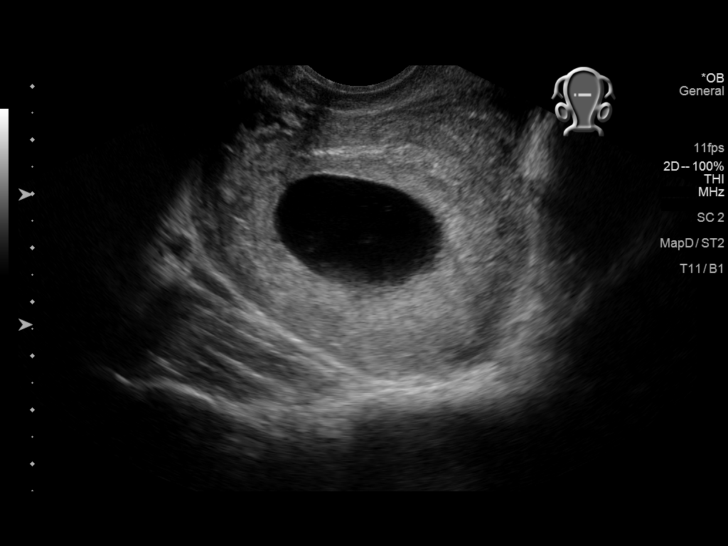
[im 73/94]
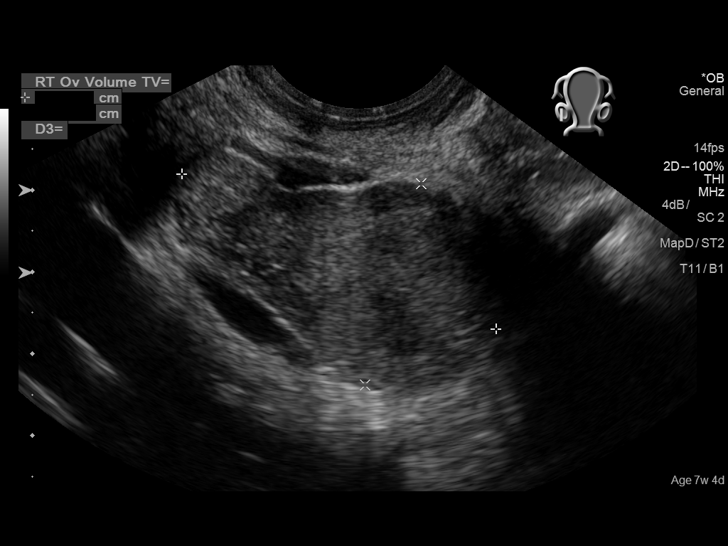
[im 80/94]
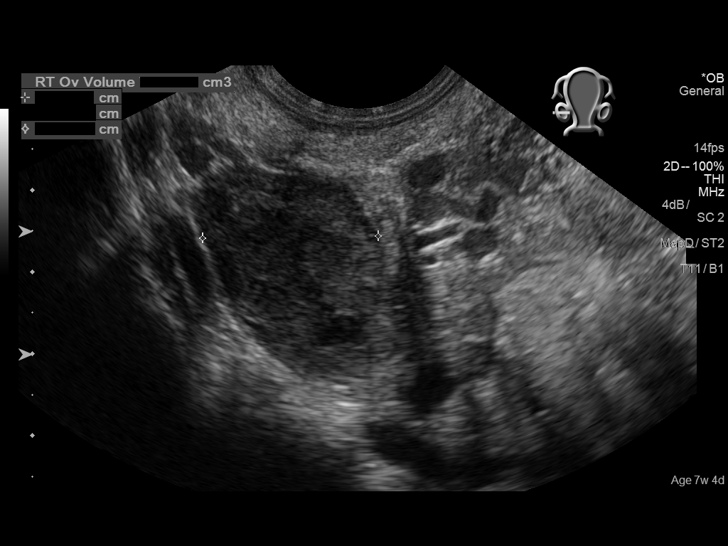
[im 87/94]
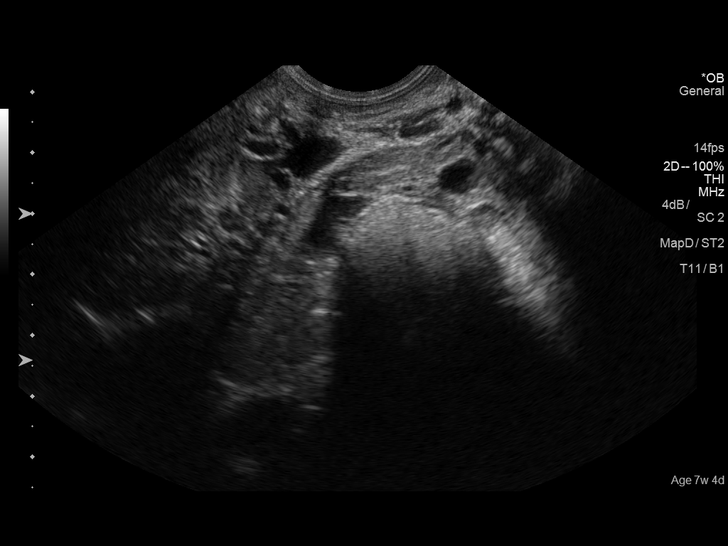
[im 94/94]
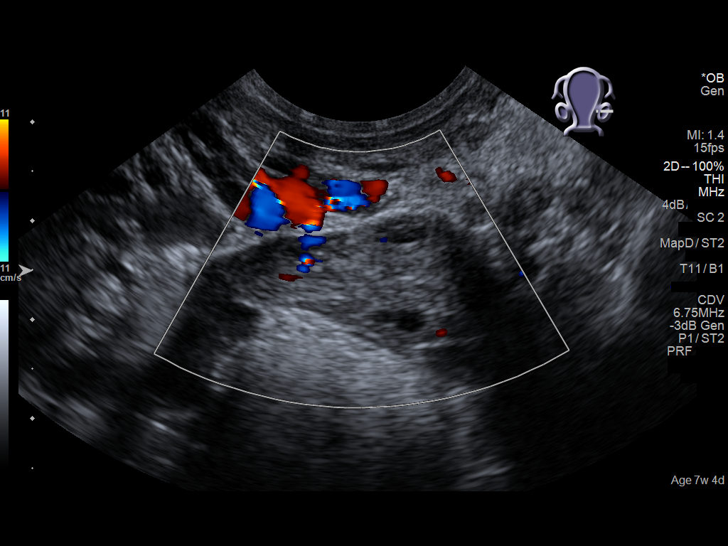

[14 of 28 positions shown; findings below may reference images not displayed]

FINDINGS: Intrauterine gestational sac: Single

Yolk sac:  Yes

Embryo:  Yes

Cardiac Activity: Yes

Heart Rate: 131  bpm

CRL:  10  mm   7 w   1 d                  US EDC: 02/03/2017

Maternal uterus/adnexae:

Subchorionic hemorrhage: Small subchorionic hemorrhage noted.

Right ovary: There is a complicated cyst containing diffuse low
level echoes measuring 2.5 x 1.9 x 1.7 cm.

Left ovary: Normal

Other :None

Free fluid:  Small volume of free fluid noted.
IMPRESSION: 1. Single living intrauterine gestation with an estimated
gestational age of 7 weeks and 1 day.
2. Small subchorionic hemorrhage
3. Suspect right ovary hemorrhagic cysts.
4. Small volume of free fluid noted within the pelvis.

## 2018-08-13 ENCOUNTER — Encounter: Payer: Medicaid Other | Admitting: Certified Nurse Midwife

## 2018-08-14 ENCOUNTER — Other Ambulatory Visit: Payer: Self-pay

## 2018-08-14 ENCOUNTER — Encounter: Payer: Self-pay | Admitting: Certified Nurse Midwife

## 2018-08-14 ENCOUNTER — Ambulatory Visit (INDEPENDENT_AMBULATORY_CARE_PROVIDER_SITE_OTHER): Payer: Medicaid Other | Admitting: Certified Nurse Midwife

## 2018-08-14 VITALS — BP 103/60 | HR 94 | Ht 69.0 in | Wt 133.7 lb

## 2018-08-14 DIAGNOSIS — Z3042 Encounter for surveillance of injectable contraceptive: Secondary | ICD-10-CM

## 2018-08-14 DIAGNOSIS — Z30432 Encounter for removal of intrauterine contraceptive device: Secondary | ICD-10-CM | POA: Diagnosis not present

## 2018-08-14 MED ORDER — MEDROXYPROGESTERONE ACETATE 150 MG/ML IM SUSP: 150.0000 mg | INTRAMUSCULAR | 4 refills | Status: DC

## 2018-08-14 NOTE — Progress Notes (Signed)
Patient here for Mirena IUD removal, will like to discuss other birth control options.

## 2018-08-14 NOTE — Progress Notes (Signed)
ADARA KITTLE is a 28 y.o. year old [id number] African American female who presents for removal of a Mirena IUD. Her Mirena IUD was placed 03/30/2017.   BP 103/60   Pulse 94   Ht 5\' 9"  (1.753 m)   Wt 133 lb 11.2 oz (60.6 kg)   LMP 08/13/2018 (Exact Date)   BMI 19.74 kg/m   Time out was performed.  A small plastic speculum was placed in the vagina.  The cervix was visualized, and the strings were visible. They were grasped and the Mirena was easily removed intact without complications.   Rx: Depo, see orders  RTC for injection or sooner if needed   Diona Fanti, CNM [facility], [facility] 08/14/18 3:37 PM

## 2018-08-14 NOTE — Patient Instructions (Signed)
Medroxyprogesterone injection [Contraceptive] What is this medicine? MEDROXYPROGESTERONE (me DROX ee proe JES te rone) contraceptive injections prevent pregnancy. They provide effective birth control for 3 months. Depo-subQ Provera 104 is also used for treating pain related to endometriosis. This medicine may be used for other purposes; ask your health care provider or pharmacist if you have questions. COMMON BRAND NAME(S): Depo-Provera, Depo-subQ Provera 104 What should I tell my health care provider before I take this medicine? They need to know if you have any of these conditions:  frequently drink alcohol  asthma  blood vessel disease or a history of a blood clot in the lungs or legs  bone disease such as osteoporosis  breast cancer  diabetes  eating disorder (anorexia nervosa or bulimia)  high blood pressure  HIV infection or AIDS  kidney disease  liver disease  mental depression  migraine  seizures (convulsions)  stroke  tobacco smoker  vaginal bleeding  an unusual or allergic reaction to medroxyprogesterone, other hormones, medicines, foods, dyes, or preservatives  pregnant or trying to get pregnant  breast-feeding How should I use this medicine? Depo-Provera Contraceptive injection is given into a muscle. Depo-subQ Provera 104 injection is given under the skin. These injections are given by a health care professional. You must not be pregnant before getting an injection. The injection is usually given during the first 5 days after the start of a menstrual period or 6 weeks after delivery of a baby. Talk to your pediatrician regarding the use of this medicine in children. Special care may be needed. These injections have been used in female children who have started having menstrual periods. Overdosage: If you think you have taken too much of this medicine contact a poison control center or emergency room at once. NOTE: This medicine is only for you. Do not  share this medicine with others. What if I miss a dose? Try not to miss a dose. You must get an injection once every 3 months to maintain birth control. If you cannot keep an appointment, call and reschedule it. If you wait longer than 13 weeks between Depo-Provera contraceptive injections or longer than 14 weeks between Depo-subQ Provera 104 injections, you could get pregnant. Use another method for birth control if you miss your appointment. You may also need a pregnancy test before receiving another injection. What may interact with this medicine? Do not take this medicine with any of the following medications:  bosentan This medicine may also interact with the following medications:  aminoglutethimide  antibiotics or medicines for infections, especially rifampin, rifabutin, rifapentine, and griseofulvin  aprepitant  barbiturate medicines such as phenobarbital or primidone  bexarotene  carbamazepine  medicines for seizures like ethotoin, felbamate, oxcarbazepine, phenytoin, topiramate  modafinil  St. John's wort This list may not describe all possible interactions. Give your health care provider a list of all the medicines, herbs, non-prescription drugs, or dietary supplements you use. Also tell them if you smoke, drink alcohol, or use illegal drugs. Some items may interact with your medicine. What should I watch for while using this medicine? This drug does not protect you against HIV infection (AIDS) or other sexually transmitted diseases. Use of this product may cause you to lose calcium from your bones. Loss of calcium may cause weak bones (osteoporosis). Only use this product for more than 2 years if other forms of birth control are not right for you. The longer you use this product for birth control the more likely you will be at risk   for weak bones. Ask your health care professional how you can keep strong bones. You may have a change in bleeding pattern or irregular periods.  Many females stop having periods while taking this drug. If you have received your injections on time, your chance of being pregnant is very low. If you think you may be pregnant, see your health care professional as soon as possible. Tell your health care professional if you want to get pregnant within the next year. The effect of this medicine may last a long time after you get your last injection. What side effects may I notice from receiving this medicine? Side effects that you should report to your doctor or health care professional as soon as possible:  allergic reactions like skin rash, itching or hives, swelling of the face, lips, or tongue  breast tenderness or discharge  breathing problems  changes in vision  depression  feeling faint or lightheaded, falls  fever  pain in the abdomen, chest, groin, or leg  problems with balance, talking, walking  unusually weak or tired  yellowing of the eyes or skin Side effects that usually do not require medical attention (report to your doctor or health care professional if they continue or are bothersome):  acne  fluid retention and swelling  headache  irregular periods, spotting, or absent periods  temporary pain, itching, or skin reaction at site where injected  weight gain This list may not describe all possible side effects. Call your doctor for medical advice about side effects. You may report side effects to FDA at 1-800-FDA-1088. Where should I keep my medicine? This does not apply. The injection will be given to you by a health care professional. NOTE: This sheet is a summary. It may not cover all possible information. If you have questions about this medicine, talk to your doctor, pharmacist, or health care provider.  2020 Elsevier/Gold Standard (2008-02-01 18:37:56)  

## 2018-08-16 ENCOUNTER — Ambulatory Visit (INDEPENDENT_AMBULATORY_CARE_PROVIDER_SITE_OTHER): Payer: Medicaid Other | Admitting: Certified Nurse Midwife

## 2018-08-16 ENCOUNTER — Other Ambulatory Visit: Payer: Self-pay

## 2018-08-16 VITALS — BP 108/70 | HR 80 | Ht 69.0 in | Wt 135.6 lb

## 2018-08-16 DIAGNOSIS — Z3042 Encounter for surveillance of injectable contraceptive: Secondary | ICD-10-CM | POA: Diagnosis not present

## 2018-08-16 DIAGNOSIS — Z3202 Encounter for pregnancy test, result negative: Secondary | ICD-10-CM

## 2018-08-16 LAB — POCT URINE PREGNANCY: Preg Test, Ur: NEGATIVE

## 2018-08-16 MED ORDER — MEDROXYPROGESTERONE ACETATE 150 MG/ML IM SUSP
150.0000 mg | Freq: Once | INTRAMUSCULAR | Status: AC
  Administered 2018-08-16: 12:00:00 150 mg via INTRAMUSCULAR

## 2018-08-16 NOTE — Progress Notes (Signed)
I have reviewed the patient record and concur with the management and plan of care.    Diona Fanti, CNM [facility], [facility] 08/16/18 11:50 AM

## 2018-08-16 NOTE — Progress Notes (Signed)
Date last pap: 08/03/2016. Last Depo-Provera: First injection. UPT: negative.  Side Effects if any: none. Serum HCG indicated? n/a. Depo-Provera 150 mg IM given by: FH, LPN. Next appointment due Oct 8-22, 2020.   BP 108/70   Pulse 80   Ht 5\' 9"  (1.753 m)   Wt 135 lb 9.6 oz (61.5 kg)   LMP 08/13/2018 (Exact Date)   BMI 20.02 kg/m

## 2018-11-05 ENCOUNTER — Encounter: Payer: Self-pay | Admitting: Certified Nurse Midwife

## 2018-11-09 ENCOUNTER — Ambulatory Visit (INDEPENDENT_AMBULATORY_CARE_PROVIDER_SITE_OTHER): Payer: Medicaid Other | Admitting: Certified Nurse Midwife

## 2018-11-09 ENCOUNTER — Other Ambulatory Visit: Payer: Self-pay

## 2018-11-09 VITALS — BP 114/77 | HR 72 | Wt 143.1 lb

## 2018-11-09 DIAGNOSIS — Z3042 Encounter for surveillance of injectable contraceptive: Secondary | ICD-10-CM | POA: Diagnosis not present

## 2018-11-09 MED ORDER — MEDROXYPROGESTERONE ACETATE 150 MG/ML IM SUSP
150.0000 mg | Freq: Once | INTRAMUSCULAR | Status: AC
  Administered 2018-11-09: 11:00:00 150 mg via INTRAMUSCULAR

## 2018-11-09 NOTE — Progress Notes (Signed)
Date last pap: NA Last Depo-Provera: 08/16/2018 Side Effects if any: NA. Serum HCG indicated? NA. Depo-Provera 150 mg IM given by: [facility] CMA . Next appointment due 01/25/2019-02/08/2019.  Vitals:   11/09/18 1056  BP: 114/77  Pulse: 72

## 2019-01-28 ENCOUNTER — Ambulatory Visit: Payer: Medicaid Other

## 2019-01-29 ENCOUNTER — Other Ambulatory Visit: Payer: Self-pay

## 2019-01-29 ENCOUNTER — Ambulatory Visit (INDEPENDENT_AMBULATORY_CARE_PROVIDER_SITE_OTHER): Payer: Medicaid Other | Admitting: Obstetrics and Gynecology

## 2019-01-29 VITALS — BP 98/65 | HR 74 | Ht 69.0 in | Wt 156.1 lb

## 2019-01-29 DIAGNOSIS — Z3042 Encounter for surveillance of injectable contraceptive: Secondary | ICD-10-CM | POA: Diagnosis not present

## 2019-01-29 MED ORDER — MEDROXYPROGESTERONE ACETATE 150 MG/ML IM SUSP
150.0000 mg | Freq: Once | INTRAMUSCULAR | Status: AC
  Administered 2019-01-29: 150 mg via INTRAMUSCULAR

## 2019-01-29 NOTE — Progress Notes (Signed)
Date last pap: 08/03/2016 (ASCUS/negative HPV). Last Depo-Provera: 11/09/2018. Side Effects if any: None, per patient. Serum HCG indicated? N/A, patient is witin dates. Depo-Provera 150 mg IM given by: Park Meo, CMA. Next appointment due 3/23-04/30/2019.  BP 98/65   Pulse 74   Ht 5\' 9"  (1.753 m)   Wt 156 lb 1.6 oz (70.8 kg)   BMI 23.05 kg/m

## 2019-04-15 ENCOUNTER — Ambulatory Visit: Payer: Medicaid Other

## 2019-04-16 ENCOUNTER — Ambulatory Visit: Payer: Medicaid Other

## 2019-04-22 ENCOUNTER — Other Ambulatory Visit: Payer: Self-pay

## 2019-04-22 ENCOUNTER — Ambulatory Visit (INDEPENDENT_AMBULATORY_CARE_PROVIDER_SITE_OTHER): Payer: Medicaid Other | Admitting: Certified Nurse Midwife

## 2019-04-22 VITALS — BP 108/68 | HR 80 | Ht 69.0 in | Wt 169.6 lb

## 2019-04-22 DIAGNOSIS — Z3042 Encounter for surveillance of injectable contraceptive: Secondary | ICD-10-CM | POA: Diagnosis not present

## 2019-04-22 MED ORDER — MEDROXYPROGESTERONE ACETATE 150 MG/ML IM SUSP
150.0000 mg | Freq: Once | INTRAMUSCULAR | Status: AC
  Administered 2019-04-22: 150 mg via INTRAMUSCULAR

## 2019-04-22 NOTE — Progress Notes (Signed)
Date last pap: 08/03/2016. Last Depo-Provera 01/29/2019: . Side Effects if any: none Serum HCG indicated? n/a. Depo-Provera 150 mg IM given by: FHampton, LPN. Next appointment due June 14-28, 2021.   BP 108/68   Pulse 80   Ht 5\' 9"  (1.753 m)   Wt 169 lb 9.6 oz (76.9 kg)   LMP 03/13/2019   BMI 25.05 kg/m

## 2019-04-22 NOTE — Progress Notes (Signed)
I have reviewed the record and concur with patient management and plan of care.    Gunnar Bulla, CNM [facility], [facility] 04/22/19 9:40 AM

## 2019-06-03 ENCOUNTER — Encounter: Payer: Medicaid Other | Admitting: Certified Nurse Midwife

## 2019-07-15 ENCOUNTER — Other Ambulatory Visit: Payer: Self-pay

## 2019-07-15 ENCOUNTER — Ambulatory Visit (INDEPENDENT_AMBULATORY_CARE_PROVIDER_SITE_OTHER): Payer: Medicaid Other | Admitting: Certified Nurse Midwife

## 2019-07-15 DIAGNOSIS — Z3042 Encounter for surveillance of injectable contraceptive: Secondary | ICD-10-CM

## 2019-07-15 MED ORDER — MEDROXYPROGESTERONE ACETATE 150 MG/ML IM SUSP: 150.0000 mg | INTRAMUSCULAR | 0 refills | Status: DC

## 2019-07-15 MED ORDER — MEDROXYPROGESTERONE ACETATE 150 MG/ML IM SUSP
150.0000 mg | Freq: Once | INTRAMUSCULAR | Status: AC
  Administered 2019-07-15: 09:00:00 150 mg via INTRAMUSCULAR

## 2019-07-15 NOTE — Progress Notes (Signed)
Date last pap: 08/03/2016. Last Depo-Provera: 04/22/2019. Side Effects if any: none. Serum HCG indicated? n/a Depo-Provera 150 mg IM given by: Shawn Route, LPN. Next appointment due Sept 6-20, 2021.

## 2019-10-03 ENCOUNTER — Telehealth: Payer: Self-pay | Admitting: Certified Nurse Midwife

## 2019-10-03 NOTE — Telephone Encounter (Signed)
Advise patient that she will receive refill at [facility]. Office will administer her Depo due that day. Thanks, Serafina Royals, CNM

## 2019-10-03 NOTE — Telephone Encounter (Signed)
Pt called in and needs a refill on her depo injection, send to [facility] on [address] Please advise

## 2019-10-14 ENCOUNTER — Encounter: Payer: Medicaid Other | Admitting: Certified Nurse Midwife

## 2019-10-24 ENCOUNTER — Ambulatory Visit: Payer: Self-pay

## 2019-10-24 ENCOUNTER — Ambulatory Visit (INDEPENDENT_AMBULATORY_CARE_PROVIDER_SITE_OTHER): Payer: Medicaid Other | Admitting: Certified Nurse Midwife

## 2019-10-24 DIAGNOSIS — Z3202 Encounter for pregnancy test, result negative: Secondary | ICD-10-CM

## 2019-10-24 DIAGNOSIS — Z3042 Encounter for surveillance of injectable contraceptive: Secondary | ICD-10-CM | POA: Diagnosis not present

## 2019-10-24 LAB — POCT URINE PREGNANCY: Preg Test, Ur: NEGATIVE

## 2019-10-24 MED ORDER — MEDROXYPROGESTERONE ACETATE 150 MG/ML IM SUSP
150.0000 mg | Freq: Once | INTRAMUSCULAR | Status: AC
  Administered 2019-10-24: 15:00:00 150 mg via INTRAMUSCULAR

## 2019-10-24 NOTE — Progress Notes (Signed)
Last depo inj: 07/15/19 UPT: NEGATIVE Side effects: NONE Next Depo- Provera injection due: 01/09/20-01/23/20 Annual exam due: 08/04/19

## 2019-10-24 NOTE — Progress Notes (Signed)
I have reviewed the record and concur with patient management and plan of care.    Serafina Royals, CNM [facility], [facility] 10/24/19 5:18 PM

## 2019-10-25 ENCOUNTER — Encounter: Payer: Medicaid Other | Admitting: Certified Nurse Midwife

## 2020-01-02 ENCOUNTER — Encounter: Payer: Medicaid Other | Admitting: Certified Nurse Midwife

## 2020-01-06 ENCOUNTER — Telehealth: Payer: Self-pay

## 2020-01-06 NOTE — Telephone Encounter (Signed)
mychart message sent

## 2020-01-13 ENCOUNTER — Other Ambulatory Visit: Payer: Self-pay

## 2020-01-13 MED ORDER — MEDROXYPROGESTERONE ACETATE 150 MG/ML IM SUSP
150.0000 mg | INTRAMUSCULAR | 0 refills | Status: DC
Start: 2020-01-13 — End: 2020-05-25

## 2020-01-13 NOTE — Telephone Encounter (Signed)
Pt moved to [place] and has not found PCP. 1 depo refill sent to patients preferred pharmacy (as requested).

## 2020-01-23 ENCOUNTER — Telehealth: Payer: Self-pay

## 2020-01-23 NOTE — Telephone Encounter (Signed)
error 

## 2020-05-07 DIAGNOSIS — Z3042 Encounter for surveillance of injectable contraceptive: Secondary | ICD-10-CM

## 2020-05-07 NOTE — Telephone Encounter (Signed)
Marleny made an appointment for an annual with Marcelino Duster on 05/25/20 @ 1:45pm per Darol Destine.  Patient's last Depo was 09/2019.  Patient's last visit with a provider was 07/2018.  Patient wanted to know if the Depo could be called in to her pharmacy so she can bring it with her to the appointment so she can receive her injection the same day?  Please advise.

## 2020-05-22 ENCOUNTER — Telehealth: Payer: Self-pay | Admitting: Certified Nurse Midwife

## 2020-05-22 NOTE — Telephone Encounter (Signed)
Patient was notified that she will get Depo Injection in house during her appointment on Monday.

## 2020-05-22 NOTE — Telephone Encounter (Signed)
Patient asked if her depo injection RX was sent in to her pharmacy - she does have an apt on Monday- was wanting to get depo injection at that visit. Please Advise.

## 2020-05-25 ENCOUNTER — Other Ambulatory Visit: Payer: Self-pay

## 2020-05-25 ENCOUNTER — Ambulatory Visit (INDEPENDENT_AMBULATORY_CARE_PROVIDER_SITE_OTHER): Payer: Medicaid Other | Admitting: Certified Nurse Midwife

## 2020-05-25 ENCOUNTER — Other Ambulatory Visit (HOSPITAL_COMMUNITY)
Admission: RE | Admit: 2020-05-25 | Discharge: 2020-05-25 | Disposition: A | Discharge status: Home / Self Care | Payer: Medicaid Other | Source: Ambulatory Visit | Attending: Certified Nurse Midwife | Admitting: Certified Nurse Midwife

## 2020-05-25 ENCOUNTER — Encounter: Payer: Self-pay | Admitting: Certified Nurse Midwife

## 2020-05-25 VITALS — BP 111/73 | HR 83 | Resp 16 | Ht 69.0 in | Wt 158.6 lb

## 2020-05-25 DIAGNOSIS — Z124 Encounter for screening for malignant neoplasm of cervix: Secondary | ICD-10-CM

## 2020-05-25 DIAGNOSIS — Z3042 Encounter for surveillance of injectable contraceptive: Secondary | ICD-10-CM | POA: Diagnosis not present

## 2020-05-25 DIAGNOSIS — Z1159 Encounter for screening for other viral diseases: Secondary | ICD-10-CM

## 2020-05-25 DIAGNOSIS — R87610 Atypical squamous cells of undetermined significance on cytologic smear of cervix (ASC-US): Secondary | ICD-10-CM | POA: Diagnosis not present

## 2020-05-25 DIAGNOSIS — Z01419 Encounter for gynecological examination (general) (routine) without abnormal findings: Secondary | ICD-10-CM | POA: Diagnosis not present

## 2020-05-25 MED ORDER — MEDROXYPROGESTERONE ACETATE 150 MG/ML IM SUSP
150.0000 mg | Freq: Once | INTRAMUSCULAR | Status: AC
  Administered 2020-05-25: 150 mg via INTRAMUSCULAR

## 2020-05-25 MED ORDER — MEDROXYPROGESTERONE ACETATE 150 MG/ML IM SUSP
150.0000 mg | INTRAMUSCULAR | 3 refills | Status: AC
Start: 2020-05-25 — End: ?

## 2020-05-25 NOTE — Progress Notes (Signed)
Date last pap: 05/25/2020 Last Depo-Provera: 10/24/2019 Side Effects if any: None indicated. Serum HCG indicated? Had LMP on 05/13/2020 Depo-Provera 150 mg IM given by: Santiago Bumpers, CMA Next appointment due: July 18-August 1

## 2020-05-25 NOTE — Progress Notes (Signed)
ANNUAL PREVENTATIVE CARE GYN  ENCOUNTER NOTE  Subjective:       Kara Moses is a 30 y.o. G5P2002 female here for a routine annual gynecologic exam.  Current complaints: 1. Needs Pap smear 2. Requests Depo provera refill  Denies difficulty breathing or respiratory distress, chest pain, abdominal pain, excessive vaginal bleeding, dysuria, and leg pain or swelling.    Gynecologic History  Patient's last menstrual period was 05/13/2020 (exact date).  Contraception: Depo-Provera injections  Last Pap: 07/2016. Results were: abnormal, AS-CUS/HPV negative  Obstetric History OB History  Gravida Para Term Preterm AB Living  2 2 2     2   SAB IAB Ectopic Multiple Live Births        0 2    # Outcome Date GA Lbr Len/2nd Weight Sex Delivery Anes PTL Lv  2 Term 02/06/17 [redacted]w[redacted]d  9 lb 1.7 oz (4.13 kg) F Vag-Spont None  LIV  1 Term 10/22/14 [redacted]w[redacted]d  7 lb 15 oz (3.6 kg) F Vag-Spont None  LIV    Past Medical History:  Diagnosis Date  . Medical history non-contributory     Past Surgical History:  Procedure Laterality Date  . BREAST LUMPECTOMY    . brice tumor      Current Outpatient Medications on File Prior to Visit  Medication Sig Dispense Refill  . medroxyPROGESTERone (DEPO-PROVERA) 150 MG/ML injection Inject 1 mL (150 mg total) into the muscle every 3 (three) months. 1 mL 0   No current facility-administered medications on file prior to visit.    No Known Allergies  Social History   Socioeconomic History  . Marital status: Single    Spouse name: Not on file  . Number of children: Not on file  . Years of education: Not on file  . Highest education level: Not on file  Occupational History  . Not on file  Tobacco Use  . Smoking status: Former [redacted]w[redacted]d  . Smokeless tobacco: Never Used  Vaping Use  . Vaping Use: Never used  Substance and Sexual Activity  . Alcohol use: No    Alcohol/week: 0.0 standard drinks  . Drug use: No  . Sexual activity: Yes    Birth  control/protection: Injection  Other Topics Concern  . Not on file  Social History Narrative  . Not on file   Social Determinants of Health   Financial Resource Strain: Not on file  Food Insecurity: Not on file  Transportation Needs: Not on file  Physical Activity: Not on file  Stress: Not on file  Social Connections: Not on file  Intimate Partner Violence: Not on file    Family History  Problem Relation Age of Onset  . Diabetes Mother   . Hypertension Mother   . Hypotension Maternal Aunt   . Seizures Maternal Aunt   . Hypertension Maternal Grandmother   . Breast cancer Neg Hx   . Ovarian cancer Neg Hx   . Colon cancer Neg Hx     The following portions of the patient's history were reviewed and updated as appropriate: allergies, current medications, past family history, past medical history, past social history, past surgical history and problem list.  Review of Systems  ROS negative except as noted above. Information obtained from patient.    Objective:   BP 111/73   Pulse 83   Resp 16   Ht 5\' 9"  (1.753 m)   Wt 158 lb 9.6 oz (71.9 kg)   LMP 05/13/2020 (Exact Date)   BMI 23.42 kg/m  CONSTITUTIONAL: Well-developed, well-nourished female in no acute distress.   PSYCHIATRIC: Normal mood and affect. Normal behavior. Normal judgment and thought content.  NEUROLGIC: Alert and oriented to person, place, and time. Normal muscle tone coordination. No cranial  nerve deficit noted.  HENT:  Normocephalic, atraumatic.  EYES: Conjunctivae and EOM are normal.   NECK: Normal range of motion, supple, no masses.  Normal thyroid.   SKIN: Skin is warm and dry. No rash noted. Not diaphoretic. No erythema. No pallor.  CARDIOVASCULAR: Normal heart rate noted, regular rhythm, no murmur.  RESPIRATORY: Clear to auscultation bilaterally. Effort and breath sounds normal, no problems with respiration noted.  BREASTS: Symmetric in size. No masses, skin changes, nipple drainage,  or lymphadenopathy.  ABDOMEN: Soft, normal bowel sounds, no distention noted.  No tenderness, rebound or guarding.   PELVIC:  External Genitalia: Normal  BUS: Normal  Vagina: Normal  Cervix: Normal, Pap collected   MUSCULOSKELETAL: Normal range of motion. No tenderness.  No cyanosis, clubbing, or edema.  2+ distal pulses.  LYMPHATIC: No Axillary, Supraclavicular, or Inguinal Adenopathy.  Assessment:   Annual gynecologic examination 30 y.o.   Contraception: Depo-Provera injections   Normal BMI   Problem List Items Addressed This Visit   None   Visit Diagnoses    Need for hepatitis C screening test    -  Primary   Cervical cancer screening       Depot contraception       Well woman exam          Plan:   Pap: Pap, Reflex if ASCUS  Labs: Declined  Routine preventative health maintenance measures emphasized: Exercise/Diet/Weight control, Tobacco Warnings, Alcohol/Substance use risks and Stress Management; see AVS  Rx Depo, see orders; dose given today, see chart  Reviewed red flag symptoms and when to call  RTC x 3 months for next depo injection  Return to Clinic - 1 Year for ANNUAL EXAM   Serafina Royals, CNM  [facility], [facility] 05/25/20 1:48 PM

## 2020-05-25 NOTE — Patient Instructions (Signed)
Contraceptive Injection A contraceptive injection is a shot that prevents pregnancy. It is also called a birth control shot. The shot contains the hormone progestin, which prevents pregnancy by:  Stopping the ovaries from releasing eggs.  Thickening cervical mucus to prevent sperm from entering the cervix.  Thinning the lining of the uterus to prevent a fertilized egg from attaching to the uterus. Contraceptive injections are given under the skin (subcutaneous) or into a muscle (intramuscular). For these shots to work, you must get one of them every 3 months (12-13 weeks) from a health care provider. Tell a health care provider about:  Any allergies you have.  All medicines you are taking, including vitamins, herbs, eye drops, creams, and over-the-counter medicines.  Any blood disorders you have.  Any medical conditions you have.  Whether you are pregnant or may be pregnant. What are the risks? Generally, this is a safe procedure. However, problems may occur, including:  Mood changes or depression.  Loss of bone density (osteoporosis) after long-term use.  Blood clots. This is rare.  Higher risk of an egg being fertilized outside your uterus (ectopic pregnancy).This is rare. What happens before the procedure?  Your health care provider may do a routine physical exam.  You may have a test to make sure you are not pregnant. What happens during the procedure?  The area where the shot will be given will be cleaned and sanitized with alcohol.  A needle will be inserted into a muscle in your upper arm or buttock, or into the skin of your thigh or abdomen. The needle will be attached to a syringe with the medicine inside of it.  The medicine will be pushed through the syringe and injected into your body.  A small bandage (dressing) may be placed over the injection site.   What can I expect after the procedure?  After the procedure, it is common to have: ? Soreness around the  injection site for a couple of days. ? Irregular menstrual bleeding. ? Weight gain. ? Breast tenderness. ? Headaches. ? Discomfort in your abdomen.  Ask your health care provider whether you need to use an added method of birth control (backup contraception), such as a condom, sponge, or spermicide. ? If the first shot is given 1-7 days after the start of your last menstrual period, you will not need backup contraception. ? If the first shot is given at any other time during your menstrual cycle, you should avoid having sex. If you do have sex, you will need to use backup contraception for 7 days after you receive the shot. Follow these instructions at home: General instructions  Take over-the-counter and prescription medicines only as told by your health care provider.  Do not rub or massage the injection site.  Track your menstrual periods so you will know if they become irregular.  Always use a condom to protect against sexually transmitted infections (STIs).  Make sure you schedule an appointment in time for your next shot and mark it on your calendar. You must get an injection every 3 months (12-13 weeks) to prevent pregnancy. Lifestyle  Do not use any products that contain nicotine or tobacco. These products include cigarettes, chewing tobacco, and vaping devices, such as e-cigarettes. If you need help quitting, ask your health care provider.  Eat foods that are high in calcium and vitamin D, such as milk, cheese, and salmon. Doing this may help with any loss in bone density caused by the contraceptive injection. Ask your  health care provider for dietary recommendations. Contact a health care provider if you:  Have nausea or vomiting.  Have abnormal vaginal discharge or bleeding.  Miss a menstrual period or think you might be pregnant.  Experience mood changes or depression.  Feel dizzy or light-headed.  Have leg pain. Get help right away if you:  Have chest pain or  cough up blood.  Have shortness of breath.  Have a severe headache that does not go away.  Have numbness in any part of your body.  Have slurred speech or vision problems.  Have vaginal bleeding that is abnormally heavy or does not stop, or you have severe pain in your abdomen.  Have depression that does not get better with treatment. If you ever feel like you may hurt yourself or others, or have thoughts about taking your own life, get help right away. Go to your nearest emergency department or:  Call your local emergency services (911 in the [place]  Call a suicide crisis helpline, such as the National Suicide Prevention Lifeline at 816-529-7727. This is open 24 hours a day in the [place]  Text the Crisis Text Line at 223-819-2631 (in the [place] Summary  A contraceptive injection is a shot that prevents pregnancy. It is also called the birth control shot.  The shot is given under the skin (subcutaneous) or into a muscle (intramuscular).  After this procedure, it is common to have soreness around the injection site for a couple of days.  To prevent pregnancy, the shot must be given by a health care provider every 3 months (12-13 weeks).  After you have the shot, ask your health care provider whether you need to use an added method of birth control (backup contraception), such as a condom, sponge, or spermicide. This information is not intended to replace advice given to you by your health care provider. Make sure you discuss any questions you have with your health care provider. Document Revised: 07/22/2019 Document Reviewed: 07/22/2019 Elsevier Patient Education  2021 [employer].   Preventive Care 24-8 Years Old, Female Preventive care refers to lifestyle choices and visits with your health care provider that can promote health and wellness. This includes:  A yearly physical exam. This is also called an annual wellness visit.  Regular dental and eye  exams.  Immunizations.  Screening for certain conditions.  Healthy lifestyle choices, such as: ? Eating a healthy diet. ? Getting regular exercise. ? Not using drugs or products that contain nicotine and tobacco. ? Limiting alcohol use. What can I expect for my preventive care visit? Physical exam Your health care provider may check your:  Height and weight. These may be used to calculate your BMI (body mass index). BMI is a measurement that tells if you are at a healthy weight.  Heart rate and blood pressure.  Body temperature.  Skin for abnormal spots. Counseling Your health care provider may ask you questions about your:  Past medical problems.  Family's medical history.  Alcohol, tobacco, and drug use.  Emotional well-being.  Home life and relationship well-being.  Sexual activity.  Diet, exercise, and sleep habits.  Work and work Astronomer.  Access to firearms.  Method of birth control.  Menstrual cycle.  Pregnancy history. What immunizations do I need? Vaccines are usually given at various ages, according to a schedule. Your health care provider will recommend vaccines for you based on your age, medical history, and lifestyle or other factors, such as travel or where you work.  What tests do I need? Blood tests  Lipid and cholesterol levels. These may be checked every 5 years starting at age 77.  Hepatitis C test.  Hepatitis B test. Screening  Diabetes screening. This is done by checking your blood sugar (glucose) after you have not eaten for a while (fasting).  STD (sexually transmitted disease) testing, if you are at risk.  BRCA-related cancer screening. This may be done if you have a family history of breast, ovarian, tubal, or peritoneal cancers.  Pelvic exam and Pap test. This may be done every 3 years starting at age 67. Starting at age 28, this may be done every 5 years if you have a Pap test in combination with an HPV test. Talk with  your health care provider about your test results, treatment options, and if necessary, the need for more tests.   Follow these instructions at home: Eating and drinking  Eat a healthy diet that includes fresh fruits and vegetables, whole grains, lean protein, and low-fat dairy products.  Take vitamin and mineral supplements as recommended by your health care provider.  Do not drink alcohol if: ? Your health care provider tells you not to drink. ? You are pregnant, may be pregnant, or are planning to become pregnant.  If you drink alcohol: ? Limit how much you have to 0-1 drink a day. ? Be aware of how much alcohol is in your drink. In the [place] one drink equals one 12 oz bottle of beer (355 mL), one 5 oz glass of wine (148 mL), or one 1 oz glass of hard liquor (44 mL).   Lifestyle  Take daily care of your teeth and gums. Brush your teeth every morning and night with fluoride toothpaste. Floss one time each day.  Stay active. Exercise for at least 30 minutes 5 or more days each week.  Do not use any products that contain nicotine or tobacco, such as cigarettes, e-cigarettes, and chewing tobacco. If you need help quitting, ask your health care provider.  Do not use drugs.  If you are sexually active, practice safe sex. Use a condom or other form of protection to prevent STIs (sexually transmitted infections).  If you do not wish to become pregnant, use a form of birth control. If you plan to become pregnant, see your health care provider for a prepregnancy visit.  Find healthy ways to cope with stress, such as: ? Meditation, yoga, or listening to music. ? Journaling. ? Talking to a trusted person. ? Spending time with friends and family. Safety  Always wear your seat belt while driving or riding in a vehicle.  Do not drive: ? If you have been drinking alcohol. Do not ride with someone who has been drinking. ? When you are tired or distracted. ? While texting.  Wear a helmet  and other protective equipment during sports activities.  If you have firearms in your house, make sure you follow all gun safety procedures.  Seek help if you have been physically or sexually abused. What's next?  Go to your health care provider once a year for an annual wellness visit.  Ask your health care provider how often you should have your eyes and teeth checked.  Stay up to date on all vaccines. This information is not intended to replace advice given to you by your health care provider. Make sure you discuss any questions you have with your health care provider. Document Revised: 09/08/2019 Document Reviewed: 09/21/2017 Elsevier Patient Education  2021  [employer].

## 2020-05-27 LAB — CYTOLOGY - PAP
Chlamydia: NEGATIVE
Comment: NEGATIVE
Comment: NORMAL
Diagnosis: NEGATIVE
Neisseria Gonorrhea: NEGATIVE

## 2020-08-25 ENCOUNTER — Ambulatory Visit: Payer: Medicaid Other

## 2020-08-27 ENCOUNTER — Ambulatory Visit (INDEPENDENT_AMBULATORY_CARE_PROVIDER_SITE_OTHER): Payer: Medicaid Other | Admitting: Certified Nurse Midwife

## 2020-08-27 ENCOUNTER — Other Ambulatory Visit: Payer: Self-pay

## 2020-08-27 VITALS — BP 115/79 | HR 91 | Ht 69.0 in | Wt 152.5 lb

## 2020-08-27 DIAGNOSIS — Z3042 Encounter for surveillance of injectable contraceptive: Secondary | ICD-10-CM | POA: Diagnosis not present

## 2020-08-27 MED ORDER — MEDROXYPROGESTERONE ACETATE 150 MG/ML IM SUSP
150.0000 mg | Freq: Once | INTRAMUSCULAR | Status: AC
  Administered 2020-08-27: 150 mg via INTRAMUSCULAR

## 2020-08-27 NOTE — Progress Notes (Signed)
Patient presents for Depo injection. Depo administered R glute with no adverse reactions. No questions or concerns voiced by patient.

## 2020-11-18 NOTE — Progress Notes (Signed)
Date last pap: 05/25/2020. Last Depo-Provera: 08/27/2020. Side Effects if any: None. Serum HCG indicated? N/A. Depo-Provera 150 mg IM given by: Santiago Bumpers, CMA. Next appointment due Jan. 12 - Jan.26

## 2020-11-19 ENCOUNTER — Ambulatory Visit (INDEPENDENT_AMBULATORY_CARE_PROVIDER_SITE_OTHER): Payer: Medicaid Other | Admitting: Certified Nurse Midwife

## 2020-11-19 ENCOUNTER — Other Ambulatory Visit: Payer: Self-pay

## 2020-11-19 DIAGNOSIS — Z3042 Encounter for surveillance of injectable contraceptive: Secondary | ICD-10-CM

## 2020-11-19 MED ORDER — MEDROXYPROGESTERONE ACETATE 150 MG/ML IM SUSP
150.0000 mg | INTRAMUSCULAR | Status: AC
  Administered 2020-11-19 – 2021-02-05 (×2): 150 mg via INTRAMUSCULAR

## 2021-02-04 ENCOUNTER — Ambulatory Visit: Payer: Medicaid Other

## 2021-02-05 ENCOUNTER — Other Ambulatory Visit: Payer: Self-pay

## 2021-02-05 ENCOUNTER — Ambulatory Visit (INDEPENDENT_AMBULATORY_CARE_PROVIDER_SITE_OTHER): Payer: Medicaid Other | Admitting: Certified Nurse Midwife

## 2021-02-05 DIAGNOSIS — Z3042 Encounter for surveillance of injectable contraceptive: Secondary | ICD-10-CM

## 2021-02-05 NOTE — Progress Notes (Signed)
Date last pap: 05/25/2020. Last Depo-Provera: 11/19/2020. Side Effects if any: None. Serum HCG indicated? Not Applicable. Depo-Provera 150 mg IM given by: Eloina Ergle B, CMA. Next appointment due: March 13 - April 14.

## 2021-04-08 NOTE — Progress Notes (Signed)
Date last pap: 05/25/2020. ?Last Depo-Provera: 02/05/2021 ?Side Effects if any: Pt tolerated well ?Serum HCG indicated? Not Applicable. ?Depo-Provera 150 mg IM given by: Roddie Mc, CMA ?Next appointment due: 06/25/21-07/09/21 ?

## 2021-04-09 ENCOUNTER — Ambulatory Visit (INDEPENDENT_AMBULATORY_CARE_PROVIDER_SITE_OTHER): Payer: Medicaid Other | Admitting: Certified Nurse Midwife

## 2021-04-09 ENCOUNTER — Other Ambulatory Visit: Payer: Self-pay

## 2021-04-09 DIAGNOSIS — Z3042 Encounter for surveillance of injectable contraceptive: Secondary | ICD-10-CM

## 2021-04-09 MED ORDER — MEDROXYPROGESTERONE ACETATE 150 MG/ML IM SUSP
150.0000 mg | Freq: Once | INTRAMUSCULAR | Status: AC
  Administered 2021-04-09: 150 mg via INTRAMUSCULAR

## 2021-06-25 ENCOUNTER — Ambulatory Visit (INDEPENDENT_AMBULATORY_CARE_PROVIDER_SITE_OTHER): Payer: Medicaid Other | Admitting: Certified Nurse Midwife

## 2021-06-25 DIAGNOSIS — Z3042 Encounter for surveillance of injectable contraceptive: Secondary | ICD-10-CM

## 2021-06-25 MED ORDER — MEDROXYPROGESTERONE ACETATE 150 MG/ML IM SUSP
150.0000 mg | Freq: Once | INTRAMUSCULAR | Status: AC
  Administered 2021-06-25: 150 mg via INTRAMUSCULAR

## 2021-06-25 NOTE — Progress Notes (Signed)
Date last pap: 05/25/2020. Last Depo-Provera: 04/09/21 Side Effects if any: Pt tolerated well Serum HCG indicated? Not Applicable. Depo-Provera 150 mg IM given by: Georgiana Shore, CMA Next appointment due: 09/10/21-09/24/21

## 2021-06-25 NOTE — Addendum Note (Signed)
Addended by: Douglass Rivers R on: 06/25/2021 10:14 AM   Modules accepted: Level of Service

## 2021-08-11 ENCOUNTER — Encounter: Payer: Medicaid Other | Admitting: Certified Nurse Midwife

## 2021-09-09 NOTE — Progress Notes (Signed)
Side Effects if any: NONE. Depo-Provera 150 mg IM given by: Brodey Bonn, CMA. Pt presents for routine injection of depo provera contraception. Pt tolerated injection well. No concerns at this time. All questions answered.  RETURN NOV. 3 - NOV. 17 FOR NEXT DEPO INJECTION. 

## 2021-09-10 ENCOUNTER — Ambulatory Visit (INDEPENDENT_AMBULATORY_CARE_PROVIDER_SITE_OTHER): Payer: Medicaid Other | Admitting: Certified Nurse Midwife

## 2021-09-10 DIAGNOSIS — Z3042 Encounter for surveillance of injectable contraceptive: Secondary | ICD-10-CM

## 2022-02-16 ENCOUNTER — Encounter: Payer: Self-pay | Admitting: Certified Nurse Midwife

## 2022-03-07 ENCOUNTER — Ambulatory Visit: Payer: Medicaid Other | Admitting: Obstetrics & Gynecology

## 2022-03-16 ENCOUNTER — Ambulatory Visit: Payer: Medicaid Other | Admitting: Obstetrics

## 2022-04-26 ENCOUNTER — Other Ambulatory Visit: Payer: Medicaid Other

## 2023-07-31 ENCOUNTER — Ambulatory Visit

## 2023-08-03 ENCOUNTER — Encounter: Payer: Self-pay | Admitting: Nurse Practitioner

## 2023-08-03 ENCOUNTER — Ambulatory Visit: Admitting: Nurse Practitioner

## 2023-08-03 VITALS — BP 112/70 | HR 65 | Ht 69.0 in | Wt 147.4 lb

## 2023-08-03 DIAGNOSIS — Z3009 Encounter for other general counseling and advice on contraception: Secondary | ICD-10-CM

## 2023-08-03 DIAGNOSIS — Z124 Encounter for screening for malignant neoplasm of cervix: Secondary | ICD-10-CM

## 2023-08-03 DIAGNOSIS — Z113 Encounter for screening for infections with a predominantly sexual mode of transmission: Secondary | ICD-10-CM

## 2023-08-03 DIAGNOSIS — Z309 Encounter for contraceptive management, unspecified: Secondary | ICD-10-CM | POA: Diagnosis not present

## 2023-08-03 DIAGNOSIS — Z01419 Encounter for gynecological examination (general) (routine) without abnormal findings: Secondary | ICD-10-CM

## 2023-08-03 LAB — WET PREP FOR TRICH, YEAST, CLUE
Clue Cell Exam: NEGATIVE
Trichomonas Exam: NEGATIVE
Yeast Exam: NEGATIVE

## 2023-08-03 LAB — HM HIV SCREENING LAB: HM HIV Screening: NEGATIVE

## 2023-08-03 NOTE — Progress Notes (Signed)
 Pt is here for PE. Wet prep results reviewed with pt, no treatment required per standing order. Condoms declined and PCP list given to patient. Wilkie Drought, RN.

## 2023-08-03 NOTE — Addendum Note (Signed)
 Addended by: Karizma Cheek M on: 08/03/2023 02:48 PM   Modules accepted: Orders

## 2023-08-03 NOTE — Progress Notes (Signed)
 [employer] [facility] [address] [place] [address] Main phone: [phone]  Family Planning Visit - Initial Visit  Subjective:  Kara Moses is a 33 y.o.  G2P2002   being seen today for an initial annual visit.  The patient is currently using abstinence for pregnancy prevention. Patient does not want a pregnancy in the next year.   Patient reports they are not interesting in discussing or starting contraceptives today.   Patient has the following medical conditions: There are no active problems to display for this patient.   Chief Complaint  Patient presents with  . Annual Exam    Pt is here for PE    HPI Patient is a pleasant 33 y.o. female who presents to the office today for annual well woman exam, PAP, CBE, and asymptomatic STI testing  Patient reports concerns today include the following: Pain to the lower right abdominal area with a bump under the skin that presents and goes away when period starts.  Reports broken tooth to the upper left far back tooth with pain that began in April when eating [employer]. Also reports pain to the tooth on the bottom below that top broken tooth.  Patient indicates 1 female partner in the last 2 and 12 months. She reports practicing vaginal and does not use condoms. Patient reports last sex was the beginning of March, 2025. She indicates no use of  a contraception method and reports she would like to begin practicing abstinence.  Patient indicates LMP was 07/17/23.  Review of Systems  Constitutional:  Negative for weight loss.  HENT:  Negative for sore throat.   Eyes:  Negative for blurred vision.  Respiratory:  Negative for cough, shortness of breath and wheezing.   Cardiovascular:  Negative for chest pain and claudication.  Gastrointestinal:  Negative for nausea and vomiting.  Genitourinary:  Negative for dysuria and frequency.  Skin:  Negative for rash.  Neurological:   Negative for dizziness, seizures and headaches.  Endo/Heme/Allergies:  Does not bruise/bleed easily.    Diabetes screening This patient is 33 y.o. with a BMI of Body mass index is 21.77 kg/m.Kara Moses  Is patient eligible for diabetes screening (age >35 and BMI >25)?  no  Was Hgb A1c ordered? not applicable  STI screening Patient reports 1 of partners in last year.  Does this patient desire STI screening?  Yes  Hepatitis C screening Has patient been screened once for HCV in the past?  No  No results found for: HCVAB  Does the patient meet criteria for HCV testing? No  (If yes-- Screen for HCV through [facility]) Criteria:  Since the last HCV result, does the patient have any of the following? - Current drug use - Have a partner with drug use - Has been incarcerated  Hepatitis B screening Does the patient meet criteria for HBV testing? No Criteria:  -Household, sexual or needle sharing contact with HBV -History of drug use -HIV positive -Those with known Hep C  Cervical Cancer Screening  Result Date Procedure Results Follow-ups  05/25/2020 Cytology - PAP Neisseria Gonorrhea: Negative Chlamydia: Negative Adequacy: Satisfactory for evaluation; transformation zone component PRESENT. Diagnosis: - Negative for intraepithelial lesion or malignancy (NILM) Comment: Normal Reference Ranger Chlamydia - Negative Comment: Normal Reference Range Neisseria Gonorrhea - Negative   08/03/2016 Pap IG, CT/NG w/ reflex HPV when ASC-U DIAGNOSIS:: Comment (A) Specimen adequacy:: Comment Clinician Provided ICD10: Comment Performed by:: Comment Electronically signed by:: Comment  PAP Smear Comment: . PATHOLOGIST PROVIDED ICD10:: Comment Note:: Comment Test Methodology: Comment PAP Reflex: Comment Chlamydia, Nuc. Acid Amp: Negative Gonococcus by Nucleic Acid Amp: Negative     Health Maintenance Due  Topic Date Due  . Hepatitis C Screening  Never done  . Hepatitis B Vaccines (1 of 3 - 19+  3-dose series) Never done  . HPV VACCINES (1 - 3-dose SCDM series) Never done  . COVID-19 Vaccine (1 - 2024-25 season) Never done  . Cervical Cancer Screening (HPV/Pap Cotest)  05/26/2023    The following portions of the patient's history were reviewed and updated as appropriate: allergies, current medications, past family history, past medical history, past social history, past surgical history and problem list. Problem list updated.  See flowsheet for further details and programmatic requirements Hyperlink available at the top of the signed note in blue.  Flow sheet content below:  Pregnancy Intention Screening Does the patient want to become pregnant in the next year?: No Does the patient's partner want to become pregnant in the next year?: No Would the patient like to discuss contraceptive options today?: No Results Follow up Password: [email or web address] Contraception History Past methods of contraception used by patient:: Hormonal Injection Adverse effects associated with Hormonal Injection: none Sexual History What age did you start your period?: 13 How often do you have your period?: monthly Date of last sex?: 03/27/23 Has the patient had unprotected sex within the last 5 days?: No Do you have sex with men, women, both men and women?: Men only In the past 2 months how many partners have you had sex with?: 0 In the past 12 months, how many partners have you had sex with?: 1 Is it possible that any of your sex partners in the past 12 months had sex with someone else whild they were still in a sexual relationship with you?: No What ways do you have sex?: Vaginal Do you or your partner use condoms and/or dental dams every time you have vaginal, oral or anal sex?: No Do you douche?: No Date of last HIV test?:  (none) Have you ever had an STD?: Yes Have any of your partners had an STD?: Yes Partner Previous STD?: Chlamydia Date?:  (10 years ago) Have you or your partner ever shot up  drugs?: No Have any of your partners used drugs in the past?: No Have you or your partners exchanged money or drugs for sex?: No Risk Factors for Hep B Household, sexual, or needle sharing contact of a person infected with Hep B: No Sexual contact with a person who uses drugs not as prescribed?: No Currently or Ever used drugs not as prescribed: No HIV Positive: No PRep Patient: No Men who have sex with men: N/A Have Hepatitis C: No History of Incarceration: No History of Homeslessness?: No Anal sex following anal drug use?: No Risk Factors for Hep C Currently using drugs not as prescribed: No Sexual partner(s) currently using drugs as not prescribed: No History of drug use: No HIV Positive: No People with a history of incarceration: No People born between the years of 40 and 45: No Counseling All Patients: Use specific methods of contraceoptive and identify adverse effects (R), Stop tobacco use, implementing the 5A counseling approach (R), Emergency Contraception Offered (R) if unprotected sex in past 5 days and/or propyhlactically as indicated., Provide emergency contraception counseling (R), Typical use rates for method effectiveness (R), Delay future pregnancy from 18 months to 5 years (R) at [facility] visit, Appropriate  referral for additional services as needed (R) Education: Make informed decision about family planning, Reduce risk of transmission and protection from STD's and HIV, Understand BMI >25 or >18.5 is a health risk (weight management educational materials to be provided to client requests), Promoted daily consumption of MVI with folic acid if capable of conceiving., Provide GED counseling if indicated by history, Results of physical assessment and labs (if performed), PCP list given to patient Contraception Wrap Up Current Method: Abstinence End Method: Abstinence Contraception Counseling Provided: Yes How was the end contraceptive method provided?: N/A  Objective:    Vitals:   08/03/23 0901  BP: 112/70  Pulse: 65  Weight: 147 lb 6.4 oz (66.9 kg)  Height: 5' 9 (1.753 m)    Physical Exam Vitals and nursing note reviewed. Exam conducted with a chaperone present Brett Orange, CNA).  Constitutional:      Appearance: Normal appearance.  HENT:     Head: Normocephalic.     Salivary Glands: Right salivary gland is not diffusely enlarged or tender. Left salivary gland is not diffusely enlarged or tender.     Mouth/Throat:     Lips: Pink. No lesions.     Mouth: Mucous membranes are moist.     Tongue: No lesions. Tongue does not deviate from midline.     Pharynx: Oropharynx is clear. Uvula midline. No oropharyngeal exudate or posterior oropharyngeal erythema.     Tonsils: No tonsillar exudate.  Eyes:     General:        Right eye: No discharge.        Left eye: No discharge.  Neck:     Thyroid: No thyroid mass or thyroid tenderness.     Trachea: Trachea and phonation normal. No tracheal tenderness or tracheal deviation.  Cardiovascular:     Rate and Rhythm: Normal rate and regular rhythm.     Heart sounds: Normal heart sounds, S1 normal and S2 normal.  Pulmonary:     Effort: Pulmonary effort is normal.     Breath sounds: Normal breath sounds and air entry.  Chest:  Breasts:    Tanner Score is 5.     Breasts are symmetrical.     Right: Normal.     Left: Normal.       Comments: Scarring from lumpectomy.  Genitourinary:    General: Normal vulva.     Exam position: Lithotomy position.     Pubic Area: No rash or pubic lice.      Tanner stage (genital): 5.     Labia:        Right: No rash, tenderness, lesion or injury.        Left: No rash, tenderness, lesion or injury.      Vagina: Normal. No signs of injury and foreign body. No vaginal discharge, erythema, tenderness, bleeding or lesions.     Cervix: Normal. No cervical motion tenderness, discharge, friability, lesion, erythema, cervical bleeding or eversion.     Uterus: Normal.       Adnexa: Right adnexa normal and left adnexa normal.     Comments: pH<4.5   Lymphadenopathy:     Upper Body:     Right upper body: No supraclavicular or axillary adenopathy.     Left upper body: No supraclavicular or axillary adenopathy.     Lower Body: No right inguinal adenopathy. No left inguinal adenopathy.  Skin:    General: Skin is warm and dry.     Findings: No lesion or rash.  Comments: Skin tone appropriate for ethnicity.   Neurological:     Mental Status: She is alert and oriented to person, place, and time.  Psychiatric:        Attention and Perception: Attention and perception normal.        Mood and Affect: Mood and affect normal.        Speech: Speech normal.        Behavior: Behavior normal. Behavior is cooperative.        Thought Content: Thought content normal.     Assessment and Plan:  Kara Moses is a 33 y.o. female presenting to the [facility] Department for an initial annual wellness/contraceptive visit  1. Family planning (Primary) Contraception counseling:  Reviewed options based on patient desire and reproductive life plan. Patient is interested in Abstinence.   Risks, benefits, and typical effectiveness rates were reviewed.  Questions were answered.  Written information was also given to the patient to review.    The patient will follow up in  1 years for surveillance.  The patient was told to call with any further questions, or with any concerns about this method of contraception.  Emphasized use of condoms 100% of the time for STI prevention.  Emergency Contraception Precautions (ECP): Patient assessed for need of ECP. She is not a candidate based on no intercourse for 3 months.   2. Well woman exam PAP completed today.  CBE completed today.  Patient given PCP list and encouraged to establish care.  Completed dental referral form for [facility] due to dental concern.   3. Screening for venereal disease  -  Chlamydia/Gonorrhea [place] [facility] - HIV [facility] - Syphilis Serology, [facility] - WET PREP FOR TRICH, YEAST, CLUE  Return in about 1 year (around 08/02/2024) for annual well-woman exam.  No future appointments.  Clarita LITTIE Narrow, NP

## 2023-08-07 LAB — IGP, APTIMA HPV
HPV Aptima: NEGATIVE
PAP Smear Comment: 0

## 2023-08-08 ENCOUNTER — Ambulatory Visit: Payer: Self-pay | Admitting: Family Medicine

## 2023-08-08 NOTE — Progress Notes (Signed)
 PAP smear reviewed, result: NILM (normal), HPV negative. Based on this result and patient's prior cervical cancer screenings, [facility] currently recommends repeat PAP smear in 5 years with HPV co-testing.  Dorothyann Helling, MD 08/08/23  5:35 PM

## 2023-08-11 ENCOUNTER — Encounter: Payer: Self-pay | Admitting: Nurse Practitioner

## 2023-08-14 ENCOUNTER — Telehealth: Payer: Self-pay

## 2023-08-14 NOTE — Telephone Encounter (Addendum)
 Phone call to pt at [phone]. Left message stating RN with [facility] is calling re TR. Please call Adin Laker at [phone].

## 2023-08-14 NOTE — Telephone Encounter (Signed)
 Call pt re positive chlamydia result from 08/03/23 vaginal specimen. Needs tx.

## 2023-08-14 NOTE — Telephone Encounter (Signed)
 Returned phone call and spoke with pt. See details in additional 08/14/23 phone note.

## 2023-08-14 NOTE — Telephone Encounter (Signed)
 Phone call to pt at [phone]. Pt answered and confirmed password. Counseled pt re + CT result and need for tx.  Tx appt scheduled for 08/15/23 per pt request.

## 2023-08-15 ENCOUNTER — Ambulatory Visit

## 2023-08-15 DIAGNOSIS — A749 Chlamydial infection, unspecified: Secondary | ICD-10-CM

## 2023-08-15 MED ORDER — DOXYCYCLINE HYCLATE 100 MG PO TABS: 100.0000 mg | ORAL_TABLET | Freq: Two times a day (BID) | ORAL | Status: AC

## 2023-08-15 NOTE — Progress Notes (Signed)
 In nurse clinic for chlamydia tx. LMP 08/05/2023. States last sex March 2025, no sex since then. No bc method.   Treated today per [facility] Dr JAYSON Helling with Doxycycline  100 mg to take one capsule twice daily x 7 days.  Doxy info sheet given and reviewed.   The patient was dispensed Doxycycline  100 mg #14 today per [facility] Dr JAYSON Helling. I provided counseling today regarding the medication. We discussed the medication, the side effects and when to call clinic. Patient given the opportunity to ask questions. Questions answered.   TOC due 3 mo, patient has reminder.  Dontrey Snellgrove, RN
